# Patient Record
Sex: Female | Born: 1980 | Hispanic: Yes | Marital: Married | Smoking: Never smoker
Health system: Southern US, Community
[De-identification: ages and names within clinical notes are randomized; demographics above are authoritative.]

## PROBLEM LIST (undated history)

## (undated) DIAGNOSIS — F32A Depression, unspecified: Secondary | ICD-10-CM

## (undated) DIAGNOSIS — R87629 Unspecified abnormal cytological findings in specimens from vagina: Secondary | ICD-10-CM

## (undated) DIAGNOSIS — N83201 Unspecified ovarian cyst, right side: Secondary | ICD-10-CM

## (undated) HISTORY — DX: Depression, unspecified: F32.A

## (undated) HISTORY — DX: Unspecified abnormal cytological findings in specimens from vagina: R87.629

## (undated) HISTORY — DX: Unspecified ovarian cyst, right side: N83.201

---

## 2019-06-23 ENCOUNTER — Emergency Department (HOSPITAL_BASED_OUTPATIENT_CLINIC_OR_DEPARTMENT_OTHER)
Admission: EM | Admit: 2019-06-23 | Discharge: 2019-06-23 | Disposition: A | Discharge status: Home / Self Care | Attending: Emergency Medicine | Admitting: Emergency Medicine

## 2019-06-23 ENCOUNTER — Emergency Department (HOSPITAL_BASED_OUTPATIENT_CLINIC_OR_DEPARTMENT_OTHER)

## 2019-06-23 ENCOUNTER — Other Ambulatory Visit: Payer: Self-pay

## 2019-06-23 ENCOUNTER — Encounter (HOSPITAL_BASED_OUTPATIENT_CLINIC_OR_DEPARTMENT_OTHER): Payer: Self-pay | Admitting: Emergency Medicine

## 2019-06-23 DIAGNOSIS — N83202 Unspecified ovarian cyst, left side: Secondary | ICD-10-CM | POA: Insufficient documentation

## 2019-06-23 DIAGNOSIS — R1032 Left lower quadrant pain: Secondary | ICD-10-CM | POA: Insufficient documentation

## 2019-06-23 DIAGNOSIS — R102 Pelvic and perineal pain: Secondary | ICD-10-CM | POA: Diagnosis not present

## 2019-06-23 DIAGNOSIS — B3731 Acute candidiasis of vulva and vagina: Secondary | ICD-10-CM

## 2019-06-23 DIAGNOSIS — R109 Unspecified abdominal pain: Secondary | ICD-10-CM | POA: Diagnosis present

## 2019-06-23 DIAGNOSIS — B373 Candidiasis of vulva and vagina: Secondary | ICD-10-CM | POA: Diagnosis not present

## 2019-06-23 DIAGNOSIS — N760 Acute vaginitis: Secondary | ICD-10-CM | POA: Diagnosis not present

## 2019-06-23 DIAGNOSIS — B9689 Other specified bacterial agents as the cause of diseases classified elsewhere: Secondary | ICD-10-CM

## 2019-06-23 LAB — WET PREP, GENITAL
Sperm: NONE SEEN
Trich, Wet Prep: NONE SEEN

## 2019-06-23 LAB — URINALYSIS, ROUTINE W REFLEX MICROSCOPIC
Bilirubin Urine: NEGATIVE
Glucose, UA: NEGATIVE mg/dL
Hgb urine dipstick: NEGATIVE
Ketones, ur: >80 mg/dL — AB
Leukocytes,Ua: NEGATIVE
Nitrite: NEGATIVE
Protein, ur: NEGATIVE mg/dL
Specific Gravity, Urine: 1.025 (ref 1.005–1.030)
pH: 6.5 (ref 5.0–8.0)

## 2019-06-23 LAB — CBC WITH DIFFERENTIAL/PLATELET
Abs Immature Granulocytes: 0.02 10*3/uL (ref 0.00–0.07)
Basophils Absolute: 0 10*3/uL (ref 0.0–0.1)
Basophils Relative: 0 %
Eosinophils Absolute: 0 10*3/uL (ref 0.0–0.5)
Eosinophils Relative: 0 %
HCT: 38.2 % (ref 36.0–46.0)
Hemoglobin: 13.2 g/dL (ref 12.0–15.0)
Immature Granulocytes: 0 %
Lymphocytes Relative: 25 %
Lymphs Abs: 1.8 10*3/uL (ref 0.7–4.0)
MCH: 31.4 pg (ref 26.0–34.0)
MCHC: 34.6 g/dL (ref 30.0–36.0)
MCV: 90.7 fL (ref 80.0–100.0)
Monocytes Absolute: 0.4 10*3/uL (ref 0.1–1.0)
Monocytes Relative: 6 %
Neutro Abs: 5 10*3/uL (ref 1.7–7.7)
Neutrophils Relative %: 69 %
Platelets: 272 10*3/uL (ref 150–400)
RBC: 4.21 MIL/uL (ref 3.87–5.11)
RDW: 13.2 % (ref 11.5–15.5)
WBC: 7.4 10*3/uL (ref 4.0–10.5)
nRBC: 0 % (ref 0.0–0.2)

## 2019-06-23 LAB — COMPREHENSIVE METABOLIC PANEL
ALT: 26 U/L (ref 0–44)
AST: 27 U/L (ref 15–41)
Albumin: 4.3 g/dL (ref 3.5–5.0)
Alkaline Phosphatase: 23 U/L — ABNORMAL LOW (ref 38–126)
Anion gap: 12 (ref 5–15)
BUN: 13 mg/dL (ref 6–20)
CO2: 18 mmol/L — ABNORMAL LOW (ref 22–32)
Calcium: 9.2 mg/dL (ref 8.9–10.3)
Chloride: 103 mmol/L (ref 98–111)
Creatinine, Ser: 0.78 mg/dL (ref 0.44–1.00)
GFR calc Af Amer: >60 mL/min (ref >60)
GFR calc non Af Amer: >60 mL/min (ref >60)
Glucose, Bld: 127 mg/dL — ABNORMAL HIGH (ref 70–99)
Potassium: 3.4 mmol/L — ABNORMAL LOW (ref 3.5–5.1)
Sodium: 133 mmol/L — ABNORMAL LOW (ref 135–145)
Total Bilirubin: 0.8 mg/dL (ref 0.3–1.2)
Total Protein: 7.2 g/dL (ref 6.5–8.1)

## 2019-06-23 LAB — HCG, QUANTITATIVE, PREGNANCY: hCG, Beta Chain, Quant, S: <1 m[IU]/mL (ref <5)

## 2019-06-23 LAB — LIPASE, BLOOD: Lipase: 26 U/L (ref 11–51)

## 2019-06-23 MED ORDER — MORPHINE SULFATE (PF) 4 MG/ML IV SOLN
4.0000 mg | Freq: Once | INTRAVENOUS | Status: DC
  Filled 2019-06-23: qty 1

## 2019-06-23 MED ORDER — ONDANSETRON HCL 4 MG/2ML IJ SOLN
4.0000 mg | Freq: Once | INTRAMUSCULAR | Status: AC
  Administered 2019-06-23: 17:00:00 4 mg via INTRAVENOUS
  Filled 2019-06-23 (×2): qty 2

## 2019-06-23 MED ORDER — ONDANSETRON HCL 4 MG/2ML IJ SOLN
4.0000 mg | Freq: Once | INTRAMUSCULAR | Status: DC
  Filled 2019-06-23: qty 2

## 2019-06-23 MED ORDER — HYDROMORPHONE HCL 1 MG/ML IJ SOLN
1.0000 mg | Freq: Once | INTRAMUSCULAR | Status: AC
  Administered 2019-06-23: 1 mg via INTRAVENOUS
  Filled 2019-06-23: qty 1

## 2019-06-23 MED ORDER — SODIUM CHLORIDE 0.9 % IV BOLUS
1000.0000 mL | Freq: Once | INTRAVENOUS | Status: AC
  Administered 2019-06-23: 1000 mL via INTRAVENOUS

## 2019-06-23 MED ORDER — KETOROLAC TROMETHAMINE 30 MG/ML IJ SOLN
30.0000 mg | Freq: Once | INTRAMUSCULAR | Status: AC
  Administered 2019-06-23: 19:00:00 30 mg via INTRAVENOUS
  Filled 2019-06-23: qty 1

## 2019-06-23 MED ORDER — METRONIDAZOLE 500 MG PO TABS
500.0000 mg | ORAL_TABLET | Freq: Two times a day (BID) | ORAL | 0 refills | Status: DC
Start: 2019-06-23 — End: 2022-08-11

## 2019-06-23 MED ORDER — FLUCONAZOLE 200 MG PO TABS
200.0000 mg | ORAL_TABLET | Freq: Every day | ORAL | 0 refills | Status: AC
Start: 2019-06-23 — End: 2019-06-24

## 2019-06-23 MED ORDER — ONDANSETRON HCL 4 MG/2ML IJ SOLN
4.0000 mg | Freq: Once | INTRAMUSCULAR | Status: AC
  Administered 2019-06-23: 15:00:00 4 mg via INTRAVENOUS
  Filled 2019-06-23: qty 2

## 2019-06-23 MED ORDER — ONDANSETRON HCL 4 MG/2ML IJ SOLN
4.0000 mg | Freq: Once | INTRAMUSCULAR | Status: AC
  Administered 2019-06-23: 4 mg via INTRAVENOUS
  Filled 2019-06-23: qty 2

## 2019-06-23 MED ORDER — HYDROMORPHONE HCL 1 MG/ML IJ SOLN
1.0000 mg | Freq: Once | INTRAMUSCULAR | Status: AC
  Administered 2019-06-23: 15:00:00 1 mg via INTRAVENOUS
  Filled 2019-06-23: qty 1

## 2019-06-23 MED ORDER — HYDROCODONE-ACETAMINOPHEN 5-325 MG PO TABS: 1.0000 | ORAL_TABLET | Freq: Four times a day (QID) | ORAL | 0 refills | Status: DC | PRN

## 2019-06-23 MED ORDER — ONDANSETRON 4 MG PO TBDP
4.0000 mg | ORAL_TABLET | Freq: Three times a day (TID) | ORAL | 0 refills | Status: DC | PRN
Start: 2019-06-23 — End: 2022-08-11

## 2019-06-23 NOTE — ED Notes (Signed)
Patient transported to CT 

## 2019-06-23 NOTE — ED Provider Notes (Signed)
[facility] EMERGENCY DEPARTMENT Provider Note   CSN: [id number] Arrival date & time: 06/23/19  1414     History Chief Complaint  Patient presents with  . Abdominal Pain    Chelsea Mullen is a 39 y.o. female who presents for evaluation of sudden onset left lower abdominal pain that began at about 12 PM.  She was at a baseball game when she started having intense sharp pain in the left lower quadrant.  She took naproxen which did not help her symptoms.  She has some associated nausea but denies any vomiting.  She states that she felt a little bit of pressure this morning but states she thought it was her menstrual cycle starting.  Did not start having severe pain until about 12 PM this afternoon.  She states that the pain is sharp and cramping in nature and is mostly in the left lower quadrant.  She feels like maybe it radiates a little bit to the suprapubic region but not so much in the flank.  She reports that prior to onset of symptoms, she was in her normal state of health.  No fevers.  Her last menstrual cycle was about 2 to 3 weeks ago.  She has not had any chest pain, difficulty breathing, vomiting, dysuria, hematuria, vaginal discharge, vaginal bleeding.  She had a C-section but no other abdominal surgeries.  She has no personal history of kidney stones but does report family history.  The history is provided by the patient.       History reviewed. No pertinent past medical history.  There are no problems to display for this patient.   Past Surgical History:  Procedure Laterality Date  . CESAREAN SECTION       OB History   No obstetric history on file.     No family history on file.  Social History   Tobacco Use  . Smoking status: Never Smoker  . Smokeless tobacco: Never Used  Substance Use Topics  . Alcohol use: Yes  . Drug use: Not on file    Home Medications Prior to Admission medications   Medication Sig Start Date End Date Taking? Authorizing  Provider  fluconazole (DIFLUCAN) 200 MG tablet Take 1 tablet (200 mg total) by mouth daily for 1 day. 06/23/19 06/24/19  Maxwell Caul, PA-C  HYDROcodone-acetaminophen (NORCO/VICODIN) 5-325 MG tablet Take 1-2 tablets by mouth every 6 (six) hours as needed. 06/23/19   Maxwell Caul, PA-C  metroNIDAZOLE (FLAGYL) 500 MG tablet Take 1 tablet (500 mg total) by mouth 2 (two) times daily. 06/23/19   Maxwell Caul, PA-C  ondansetron (ZOFRAN ODT) 4 MG disintegrating tablet Take 1 tablet (4 mg total) by mouth every 8 (eight) hours as needed for nausea or vomiting. 06/23/19   Maxwell Caul, PA-C    Allergies    Patient has no known allergies.  Review of Systems   Review of Systems  Constitutional: Negative for fever.  Respiratory: Negative for cough and shortness of breath.   Cardiovascular: Negative for chest pain.  Gastrointestinal: Positive for abdominal pain and nausea. Negative for vomiting.  Genitourinary: Negative for dysuria, hematuria, vaginal bleeding and vaginal discharge.  Neurological: Negative for headaches.  All other systems reviewed and are negative.   Physical Exam Updated Vital Signs BP 106/62 (BP Location: Right Arm)   Pulse 60   Temp 97.7 F (36.5 C) (Oral)   Resp 14   Ht 5\' 4"  (1.626 m)   Wt 68 kg  LMP 06/05/2019   SpO2 100%   BMI 25.75 kg/m   Physical Exam Vitals and nursing note reviewed. Exam conducted with a chaperone present.  Constitutional:      Appearance: Normal appearance. She is well-developed.     Comments: Appears uncomfortable. Writhing around in pain.   HENT:     Head: Normocephalic and atraumatic.  Eyes:     General: Lids are normal.     Conjunctiva/sclera: Conjunctivae normal.     Pupils: Pupils are equal, round, and reactive to light.  Cardiovascular:     Rate and Rhythm: Normal rate and regular rhythm.     Pulses: Normal pulses.     Heart sounds: Normal heart sounds. No murmur. No friction rub. No gallop.   Pulmonary:      Effort: Pulmonary effort is normal.     Breath sounds: Normal breath sounds.     Comments: Lungs clear to auscultation bilaterally.  Symmetric chest rise.  No wheezing, rales, rhonchi. Abdominal:     Palpations: Abdomen is soft. Abdomen is not rigid.     Tenderness: There is abdominal tenderness in the left lower quadrant. There is no right CVA tenderness, left CVA tenderness or guarding.     Comments: Abdomen is soft, nondistended.  Tenderness palpation of the left lower quadrant.  No rigidity, guarding.  No CVA tenderness noted.  Genitourinary:    Comments: The exam was performed with a chaperone present. Normal external female genitalia. No lesions, rash, or sores. No CMT. Small amount of clear white discharged noted. Left adnexal tenderness noted. No mass. No right adnexal mass or tenderness.  Musculoskeletal:        General: Normal range of motion.     Cervical back: Full passive range of motion without pain.  Skin:    General: Skin is warm and dry.     Capillary Refill: Capillary refill takes less than 2 seconds.  Neurological:     Mental Status: She is alert and oriented to person, place, and time.  Psychiatric:        Speech: Speech normal.     ED Results / Procedures / Treatments   Labs (all labs ordered are listed, but only abnormal results are displayed) Labs Reviewed  WET PREP, GENITAL - Abnormal; Notable for the following components:      Result Value   Yeast Wet Prep HPF POC PRESENT (*)    Clue Cells Wet Prep HPF POC PRESENT (*)    WBC, Wet Prep HPF POC MODERATE (*)    All other components within normal limits  URINALYSIS, ROUTINE W REFLEX MICROSCOPIC - Abnormal; Notable for the following components:   Ketones, ur >80 (*)    All other components within normal limits  COMPREHENSIVE METABOLIC PANEL - Abnormal; Notable for the following components:   Sodium 133 (*)    Potassium 3.4 (*)    CO2 18 (*)    Glucose, Bld 127 (*)    Alkaline Phosphatase 23 (*)    All  other components within normal limits  CBC WITH DIFFERENTIAL/PLATELET  LIPASE, BLOOD  HCG, QUANTITATIVE, PREGNANCY  GC/CHLAMYDIA PROBE AMP ([place]) NOT AT [facility]  WET PREP  (BD AFFIRM) ([place])    EKG None  Radiology CT Renal Stone Study  Result Date: 06/23/2019 CLINICAL DATA:  Pelvic pain.  Recent urinary urgency but not today. EXAM: CT ABDOMEN AND PELVIS WITHOUT CONTRAST TECHNIQUE: Multidetector CT imaging of the abdomen and pelvis was performed following the standard protocol without IV  contrast. COMPARISON:  Pelvic ultrasound June 23, 2019 FINDINGS: Lower chest: No acute abnormality. Hepatobiliary: No focal liver abnormality is seen. No gallstones, gallbladder wall thickening, or biliary dilatation. Pancreas: Unremarkable. No pancreatic ductal dilatation or surrounding inflammatory changes. Spleen: Normal in size without focal abnormality. Adrenals/Urinary Tract: Adrenal glands are normal. Rounded increased attenuation in the left kidney on series 2, image 24 may represent a developing stone. No suspicious masses. No hydronephrosis or perinephric stranding. The bladder is unremarkable. The ureters are normal. Stomach/Bowel: There appears to be a single diverticulum near the junction of sigmoid colon and rectum. The colon and appendix are otherwise normal. Stomach and small bowel are normal. Vascular/Lymphatic: No significant vascular findings are present. No enlarged abdominal or pelvic lymph nodes. Reproductive: The uterus and right ovary are normal. There is a cystic mass in the region of the left adnexa. Recent pelvic ultrasound demonstrated 2 left ovarian cysts measuring 3.6 and 4.2 cm, accounting for this finding. Other: There is a small amount of free fluid in the pelvis, likely physiologic. Musculoskeletal: No acute or significant osseous findings. IMPRESSION: 1. There is a cystic structure in the left adnexa, better assessed on today's pelvic ultrasound. Please see that report for  full details. However, by report, 2 dominant cysts were seen in the left ovary on ultrasound. 2. Probable developing stone in the left kidney. No left renal obstruction. No ureteral stones. 3. There is a small amount of physiologic fluid in the pelvis. Electronically Signed   By: Dorise Bullion III M.D   On: 06/23/2019 17:24   US PELVIC COMPLETE W TRANSVAGINAL AND TORSION R/O  Result Date: 06/23/2019 CLINICAL DATA:  39 year old female with acute LEFT pelvic pain with nausea today. EXAM: TRANSABDOMINAL AND TRANSVAGINAL ULTRASOUND OF PELVIS DOPPLER ULTRASOUND OF OVARIES TECHNIQUE: Both transabdominal and transvaginal ultrasound examinations of the pelvis were performed. Transabdominal technique was performed for global imaging of the pelvis including uterus, ovaries, adnexal regions, and pelvic cul-de-sac. It was necessary to proceed with endovaginal exam following the transabdominal exam to visualize the ovaries and endometrium. Color and duplex Doppler ultrasound was utilized to evaluate blood flow to the ovaries. COMPARISON:  None. FINDINGS: Uterus Measurements: 8.3 x 3.8 x 4.2 cm = volume: 69 mL. No fibroids or other mass visualized. Endometrium Thickness: 4 mm.  No focal abnormality visualized. Right ovary Measurements: 2.1 x 1.6 x 1.2 cm = volume: 2.2 mL. Normal appearance/no adnexal mass. Left ovary Measurements: 5.5 x 3 x 3.3 cm = volume: 2.9 mL. Two LEFT ovarian cysts are noted, measuring 3.6 x 3 x 3.6 cm and 3.8 x 3.4 x 4.2 cm. No internal vascularity or mural nodularity noted. Pulsed Doppler evaluation of both ovaries demonstrates normal low-resistance arterial and venous waveforms. Other findings A trace amount of free pelvic fluid is noted. IMPRESSION: 1. Two LEFT ovarian cysts measuring 3.6 cm and 4.2 cm in greatest diameter. 2. Trace amount of free pelvic fluid. 3. No other significant abnormalities. No evidence of ovarian torsion. Electronically Signed   By: Margarette Canada M.D.   On: 06/23/2019 16:27     Procedures Procedures (including critical care time)  Medications Ordered in ED Medications  morphine 4 MG/ML injection 4 mg (0 mg Intravenous Hold 06/23/19 1703)  ondansetron (ZOFRAN) injection 4 mg (4 mg Intravenous Not Given 06/23/19 2032)  sodium chloride 0.9 % bolus 1,000 mL (0 mLs Intravenous Stopped 06/23/19 1629)  HYDROmorphone (DILAUDID) injection 1 mg (1 mg Intravenous Given 06/23/19 1511)  ondansetron (ZOFRAN) injection 4 mg (4  mg Intravenous Given 06/23/19 1509)  sodium chloride 0.9 % bolus 1,000 mL (0 mLs Intravenous Stopped 06/23/19 1748)  ondansetron (ZOFRAN) injection 4 mg (4 mg Intravenous Given 06/23/19 1724)  ketorolac (TORADOL) 30 MG/ML injection 30 mg (30 mg Intravenous Given 06/23/19 1854)  ondansetron (ZOFRAN) injection 4 mg (4 mg Intravenous Given 06/23/19 1853)  HYDROmorphone (DILAUDID) injection 1 mg (1 mg Intravenous Given 06/23/19 2024)  sodium chloride 0.9 % bolus 1,000 mL (0 mLs Intravenous Stopped 06/23/19 2127)    ED Course  I have reviewed the triage vital signs and the nursing notes.  Pertinent labs & imaging results that were available during my care of the patient were reviewed by me and considered in my medical decision making (see chart for details).    MDM Rules/Calculators/A&P                      39 year old female who presents for evaluation of left lower quadrant pain that began acutely at 12 PM.  Has been feeling some slight pressure prior to that but states symptoms started acutely and worsen.  Associated with nausea.  No vomiting.  No recent fevers.  No vaginal bleeding, vaginal discharge, urinary complaints.  On initial ED arrival, she is afebrile but appears very uncomfortable.  She is writhing around on bed.  She has point tenderness in the left lower quadrant.  No real true CVA tenderness.  Consider kidney stone though she does not have any personal history.  Also given her age, concern for possible ovarian torsion.  We will plan labs,  ultrasound.  Lipase normal. CBC shows no leukocytosis or anemia. CMP shows potassium of 3.4. hcg quant is negative. UA negative for any infectious etiology.   U/S shows two left ovarian cysts. No evidence of ovarian torsion. Will proceed with CT to ensure no evidence of kidney stone.   Pelvic exam showed small amount of discharge noted.  No CMT that would be concerning for PID.  She did have some left adnexal tenderness but no mass.  No right adnexal mass or tenderness.  CT scan shows a cystic structure in the left adnexa consistent with previously seen ovarian cyst. Probably developing left kidney stone. No renal obstruction or ureteral stones.   Patient had an additional episode of pain and vomiting.  Will give additional analgesics and reassess.  Urine negative for any infectious etiology.  Reevaluation.  Patient reports improvement in pain after additional rounds of analgesics.  She looks more comfortable.  She has been able to tolerate p.o. without any difficulty.  Repeat evaluation shows improvement in abdominal tenderness.  Patient states she is ready to go home.  I discussed with patient that she will need to follow-up with OB/GYN regarding her ovarian cyst.  Instructed patient that if any time, her symptoms worsen, she can return emergency department for further evaluation. At this time, patient exhibits no emergent life-threatening condition that require further evaluation in ED or admission. Discussed patient with Dr. Fredderick Phenix who is agreeable to plan. Patient had ample opportunity for questions and discussion. All patient's questions were answered with full understanding. Strict return precautions discussed. Patient expresses understanding and agreement to plan.  Portions of this note were generated with Scientist, clinical (histocompatibility and immunogenetics). Dictation errors may occur despite best attempts at proofreading.  Final Clinical Impression(s) / ED Diagnoses Final diagnoses:  Left ovarian cyst  Left lower  quadrant abdominal pain  Yeast vaginitis  Bacterial vaginitis    Rx / DC Orders ED  Discharge Orders         Ordered    HYDROcodone-acetaminophen (NORCO/VICODIN) 5-325 MG tablet  Every 6 hours PRN     06/23/19 2139    ondansetron (ZOFRAN ODT) 4 MG disintegrating tablet  Every 8 hours PRN     06/23/19 2139    metroNIDAZOLE (FLAGYL) 500 MG tablet  2 times daily     06/23/19 2139    fluconazole (DIFLUCAN) 200 MG tablet  Daily     06/23/19 2139           Maxwell Caul, PA-C 06/23/19 2223    Rolan Bucco, MD 06/23/19 [phone]

## 2019-06-23 NOTE — ED Notes (Signed)
Pt aware of need for urine specimen collection, unable to provide at this time.  Specimen cup sent with pt to u/s in case she is able to go while there.

## 2019-06-23 NOTE — ED Notes (Signed)
Pt reports return of nausea, pain remains 2/10.  Zofran given as ordered.  Pt declines morphine at this time for pain level.

## 2019-06-23 NOTE — ED Notes (Signed)
Transported to CT/US 

## 2019-06-23 NOTE — ED Triage Notes (Signed)
C/o menstrual cramps today. States she fell yesterday but was not injured.

## 2019-06-23 NOTE — ED Notes (Signed)
ED Provider at bedside. 

## 2019-06-23 NOTE — Discharge Instructions (Signed)
Your work up today showed a left ovarian cyst which can contribute to your symptoms.  Additionally, your pelvic exam showed evidence of yeast and bacterial vaginosis that we will plan to treat.  You can take Tylenol or Ibuprofen as directed for pain. You can alternate Tylenol and Ibuprofen every 4 hours. If you take Tylenol at 1pm, then you can take Ibuprofen at 5pm. Then you can take Tylenol again at 9pm.   Take pain medications as directed for break through pain. Do not drive or operate machinery while taking this medication.   Zofran as needed for nausea.  Take Flagyl as directed.  It is very important that you do not consume any alcohol while taking this medication as it will cause you to become violently ill.   After you finish Flagyl, you can take the Diflucan as directed.  As we discussed, follow-up with your OB/GYN regarding ovarian cyst.  Return the emergency department for any worsening pain, vomiting, fevers or any other worsening or concerning symptoms.

## 2019-06-24 ENCOUNTER — Emergency Department (HOSPITAL_BASED_OUTPATIENT_CLINIC_OR_DEPARTMENT_OTHER)
Admission: EM | Admit: 2019-06-24 | Discharge: 2019-06-24 | Disposition: A | Discharge status: Home / Self Care | Attending: Emergency Medicine | Admitting: Emergency Medicine

## 2019-06-24 ENCOUNTER — Emergency Department (HOSPITAL_BASED_OUTPATIENT_CLINIC_OR_DEPARTMENT_OTHER)

## 2019-06-24 ENCOUNTER — Encounter (HOSPITAL_BASED_OUTPATIENT_CLINIC_OR_DEPARTMENT_OTHER): Payer: Self-pay | Admitting: Emergency Medicine

## 2019-06-24 ENCOUNTER — Other Ambulatory Visit: Payer: Self-pay

## 2019-06-24 DIAGNOSIS — R112 Nausea with vomiting, unspecified: Secondary | ICD-10-CM | POA: Insufficient documentation

## 2019-06-24 DIAGNOSIS — R1032 Left lower quadrant pain: Secondary | ICD-10-CM | POA: Insufficient documentation

## 2019-06-24 DIAGNOSIS — N83202 Unspecified ovarian cyst, left side: Secondary | ICD-10-CM | POA: Insufficient documentation

## 2019-06-24 MED ORDER — ONDANSETRON 4 MG PO TBDP
4.0000 mg | ORAL_TABLET | Freq: Once | ORAL | Status: AC | PRN
  Administered 2019-06-24: 4 mg via ORAL

## 2019-06-24 MED ORDER — OXYCODONE-ACETAMINOPHEN 5-325 MG PO TABS
1.0000 | ORAL_TABLET | ORAL | Status: DC | PRN
  Administered 2019-06-24: 1 via ORAL
  Filled 2019-06-24: qty 1

## 2019-06-24 MED ORDER — ONDANSETRON 4 MG PO TBDP
ORAL_TABLET | ORAL | Status: AC
  Filled 2019-06-24: qty 1

## 2019-06-24 MED ORDER — OXYCODONE-ACETAMINOPHEN 5-325 MG PO TABS: 1.0000 | ORAL_TABLET | Freq: Four times a day (QID) | ORAL | 0 refills | Status: DC | PRN

## 2019-06-24 NOTE — ED Provider Notes (Signed)
10:34AM: Received phone call from Chelsea Mullen Chelsea Mullen)- requesting verbal order for Metronidazole prescribed from ED visit yesterday as it appears to have expired in their computer system. Reviewed chart from yesterday's ED visit, prescribed 500 mg BID for 7 days per AV[facility], verbal order provided to pharmacy for this prescription.    Chelsea Mullen 06/24/19 1035    Alvira Monday, MD 06/24/19 [phone]

## 2019-06-24 NOTE — Discharge Instructions (Signed)
Take Zofran for nausea prior to taking pain medication.  As we discussed, instead of taking hydrocodone, you will now take oxycodone.  You can take Tylenol or Ibuprofen as directed for pain. You can alternate Tylenol and Ibuprofen every 4 hours. If you take Tylenol at 1pm, then you can take Ibuprofen at 5pm. Then you can take Tylenol again at 9pm.   You should not take more than 4000 mg of Tylenol in a 24-hour.  Follow-up with referred OB/GYN for further evaluation.  Return to the Emergency Department immediately if you experience any worsening abdominal pain, fever, persistent nausea and vomiting, inability keep any food down, pain with urination, blood in your urine or any other worsening or concerning symptoms.

## 2019-06-24 NOTE — ED Provider Notes (Signed)
[facility] EMERGENCY DEPARTMENT Provider Note   CSN: [id number] Arrival date & time: 06/24/19  1900     History Chief Complaint  Patient presents with  . Abdominal Pain    Chelsea Mullen is a 39 y.o. female who presents for evaluation of abdominal pain.  Patient seen in the ED yesterday for evaluation of symptoms.  Patient had a work-up that showed evidence of 2 ovarian cyst on the left side.  Her CT scan was unremarkable as well as reassuring blood work.  She was able to be discharged home.  She comes back in today for continued pain, nausea/vomiting.  She reports that she was discharged home with hydrocodone, Zofran.  She took 1 dose at about 2:30 AM this morning.  She reports that she woke up and had not eaten anything so she did not want to take any of the hydrocodone.  She also did not take any Zofran.  She took Tylenol but had another episode of vomiting and she was concerned about taking any other pain medications that she had not taken any more.  She states that the pain is similar to what it was in yesterday and has not changed in location or nature.  She has not noted any fevers, dysuria, hematuria.  She denies any chest pain, difficulty breathing.  The history is provided by the patient.       History reviewed. No pertinent past medical history.  There are no problems to display for this patient.   Past Surgical History:  Procedure Laterality Date  . CESAREAN SECTION       OB History   No obstetric history on file.     History reviewed. No pertinent family history.  Social History   Tobacco Use  . Smoking status: Never Smoker  . Smokeless tobacco: Never Used  Substance Use Topics  . Alcohol use: Yes  . Drug use: Not on file    Home Medications Prior to Admission medications   Medication Sig Start Date End Date Taking? Authorizing Provider  fluconazole (DIFLUCAN) 200 MG tablet Take 1 tablet (200 mg total) by mouth daily for 1 day. 06/23/19 06/24/19   Maxwell Caul, PA-C  HYDROcodone-acetaminophen (NORCO/VICODIN) 5-325 MG tablet Take 1-2 tablets by mouth every 6 (six) hours as needed. 06/23/19   Maxwell Caul, PA-C  metroNIDAZOLE (FLAGYL) 500 MG tablet Take 1 tablet (500 mg total) by mouth 2 (two) times daily. 06/23/19   Maxwell Caul, PA-C  ondansetron (ZOFRAN ODT) 4 MG disintegrating tablet Take 1 tablet (4 mg total) by mouth every 8 (eight) hours as needed for nausea or vomiting. 06/23/19   Maxwell Caul, PA-C  oxyCODONE-acetaminophen (PERCOCET/ROXICET) 5-325 MG tablet Take 1-2 tablets by mouth every 6 (six) hours as needed for severe pain. 06/24/19   Maxwell Caul, PA-C    Allergies    Patient has no known allergies.  Review of Systems   Review of Systems  Constitutional: Negative for fever.  Respiratory: Negative for cough and shortness of breath.   Cardiovascular: Negative for chest pain.  Gastrointestinal: Positive for abdominal pain, nausea and vomiting.  Genitourinary: Negative for dysuria and hematuria.  Neurological: Negative for weakness, numbness and headaches.  All other systems reviewed and are negative.   Physical Exam Updated Vital Signs BP 110/66   Pulse 67   Temp 98.6 F (37 C) (Oral)   Resp 16   Ht 5\' 4"  (1.626 m)   Wt 72.6 kg   LMP 06/05/2019  SpO2 99%   BMI 27.46 kg/m   Physical Exam Vitals and nursing note reviewed.  Constitutional:      Appearance: Normal appearance. She is well-developed.     Comments: Patient is sitting comfortably on bed with no signs of distress.  HENT:     Head: Normocephalic and atraumatic.  Eyes:     General: Lids are normal.     Conjunctiva/sclera: Conjunctivae normal.     Pupils: Pupils are equal, round, and reactive to light.  Cardiovascular:     Rate and Rhythm: Normal rate and regular rhythm.     Pulses: Normal pulses.     Heart sounds: Normal heart sounds. No murmur. No friction rub. No gallop.   Pulmonary:     Effort: Pulmonary effort is  normal.     Breath sounds: Normal breath sounds.  Abdominal:     Palpations: Abdomen is soft. Abdomen is not rigid.     Tenderness: There is abdominal tenderness in the left lower quadrant. There is no guarding.     Comments: Abdomen is soft, nondistended.  Very mild tenderness in the left lower quadrant.  No CVA tenderness.  No rigidity, guarding.  Musculoskeletal:        General: Normal range of motion.     Cervical back: Full passive range of motion without pain.  Skin:    General: Skin is warm and dry.     Capillary Refill: Capillary refill takes less than 2 seconds.  Neurological:     Mental Status: She is alert and oriented to person, place, and time.  Psychiatric:        Speech: Speech normal.     ED Results / Procedures / Treatments   Labs (all labs ordered are listed, but only abnormal results are displayed) Labs Reviewed - No data to display  EKG None  Radiology US Transvaginal Non-OB  Result Date: 06/24/2019 CLINICAL DATA:  Initial evaluation for continued left lower quadrant pain. EXAM: TRANSVAGINAL ULTRASOUND OF PELVIS DOPPLER ULTRASOUND OF OVARIES TECHNIQUE: Transvaginal ultrasound examination of the pelvis was performed including evaluation of the uterus, ovaries, adnexal regions, and pelvic cul-de-sac. Color and duplex Doppler ultrasound was utilized to evaluate blood flow to the ovaries. COMPARISON:  Prior CT and ultrasound from 06/23/2019. FINDINGS: Uterus Measurements: 8.7 x 3.6 x 4.3 cm = volume: 71.3 mL. No fibroids or other mass visualized. Endometrium Thickness: 3 mm.  No focal abnormality visualized. Right ovary Not visualized.  No adnexal mass. Left ovary Measurements: 5.7 x 3.5 x 3.3 cm = volume: 35 mL. Two adjacent simple cysts again seen, measuring 3.7 x 2.9 x 3.2 cm and 3.7 x 4.0 x 4.1 cm respectively, little interval changed. No significant internal complexity, vascularity, or solid component. Pulsed Doppler evaluation demonstrates normal low-resistance  arterial and venous waveforms in the left ovary. Small to moderate volume free fluid within the pelvis, slightly increased from previous. IMPRESSION: 1. Two simple left ovarian cysts measuring 3.7 cm and 4.1 cm respectively, little interval change from previous. No evidence for associated torsion. 2. Small to moderate free fluid, mildly increased from previous. Electronically Signed   By: Rise Mu M.D.   On: 06/24/2019 20:10   [place] Art/Ven Flow Abd Pelv Doppler  Result Date: 06/24/2019 CLINICAL DATA:  Initial evaluation for continued left lower quadrant pain. EXAM: TRANSVAGINAL ULTRASOUND OF PELVIS DOPPLER ULTRASOUND OF OVARIES TECHNIQUE: Transvaginal ultrasound examination of the pelvis was performed including evaluation of the uterus, ovaries, adnexal regions, and pelvic cul-de-sac. Color and duplex  Doppler ultrasound was utilized to evaluate blood flow to the ovaries. COMPARISON:  Prior CT and ultrasound from 06/23/2019. FINDINGS: Uterus Measurements: 8.7 x 3.6 x 4.3 cm = volume: 71.3 mL. No fibroids or other mass visualized. Endometrium Thickness: 3 mm.  No focal abnormality visualized. Right ovary Not visualized.  No adnexal mass. Left ovary Measurements: 5.7 x 3.5 x 3.3 cm = volume: 35 mL. Two adjacent simple cysts again seen, measuring 3.7 x 2.9 x 3.2 cm and 3.7 x 4.0 x 4.1 cm respectively, little interval changed. No significant internal complexity, vascularity, or solid component. Pulsed Doppler evaluation demonstrates normal low-resistance arterial and venous waveforms in the left ovary. Small to moderate volume free fluid within the pelvis, slightly increased from previous. IMPRESSION: 1. Two simple left ovarian cysts measuring 3.7 cm and 4.1 cm respectively, little interval change from previous. No evidence for associated torsion. 2. Small to moderate free fluid, mildly increased from previous. Electronically Signed   By: Rise Mu M.D.   On: 06/24/2019 20:10   CT Renal Stone  Study  Result Date: 06/23/2019 CLINICAL DATA:  Pelvic pain.  Recent urinary urgency but not today. EXAM: CT ABDOMEN AND PELVIS WITHOUT CONTRAST TECHNIQUE: Multidetector CT imaging of the abdomen and pelvis was performed following the standard protocol without IV contrast. COMPARISON:  Pelvic ultrasound June 23, 2019 FINDINGS: Lower chest: No acute abnormality. Hepatobiliary: No focal liver abnormality is seen. No gallstones, gallbladder wall thickening, or biliary dilatation. Pancreas: Unremarkable. No pancreatic ductal dilatation or surrounding inflammatory changes. Spleen: Normal in size without focal abnormality. Adrenals/Urinary Tract: Adrenal glands are normal. Rounded increased attenuation in the left kidney on series 2, image 24 may represent a developing stone. No suspicious masses. No hydronephrosis or perinephric stranding. The bladder is unremarkable. The ureters are normal. Stomach/Bowel: There appears to be a single diverticulum near the junction of sigmoid colon and rectum. The colon and appendix are otherwise normal. Stomach and small bowel are normal. Vascular/Lymphatic: No significant vascular findings are present. No enlarged abdominal or pelvic lymph nodes. Reproductive: The uterus and right ovary are normal. There is a cystic mass in the region of the left adnexa. Recent pelvic ultrasound demonstrated 2 left ovarian cysts measuring 3.6 and 4.2 cm, accounting for this finding. Other: There is a small amount of free fluid in the pelvis, likely physiologic. Musculoskeletal: No acute or significant osseous findings. IMPRESSION: 1. There is a cystic structure in the left adnexa, better assessed on today's pelvic ultrasound. Please see that report for full details. However, by report, 2 dominant cysts were seen in the left ovary on ultrasound. 2. Probable developing stone in the left kidney. No left renal obstruction. No ureteral stones. 3. There is a small amount of physiologic fluid in the pelvis.  Electronically Signed   By: Gerome Sam III M.D   On: 06/23/2019 17:24   US PELVIC COMPLETE W TRANSVAGINAL AND TORSION R/O  Result Date: 06/23/2019 CLINICAL DATA:  39 year old female with acute LEFT pelvic pain with nausea today. EXAM: TRANSABDOMINAL AND TRANSVAGINAL ULTRASOUND OF PELVIS DOPPLER ULTRASOUND OF OVARIES TECHNIQUE: Both transabdominal and transvaginal ultrasound examinations of the pelvis were performed. Transabdominal technique was performed for global imaging of the pelvis including uterus, ovaries, adnexal regions, and pelvic cul-de-sac. It was necessary to proceed with endovaginal exam following the transabdominal exam to visualize the ovaries and endometrium. Color and duplex Doppler ultrasound was utilized to evaluate blood flow to the ovaries. COMPARISON:  None. FINDINGS: Uterus Measurements: 8.3 x 3.8 x 4.2  cm = volume: 69 mL. No fibroids or other mass visualized. Endometrium Thickness: 4 mm.  No focal abnormality visualized. Right ovary Measurements: 2.1 x 1.6 x 1.2 cm = volume: 2.2 mL. Normal appearance/no adnexal mass. Left ovary Measurements: 5.5 x 3 x 3.3 cm = volume: 2.9 mL. Two LEFT ovarian cysts are noted, measuring 3.6 x 3 x 3.6 cm and 3.8 x 3.4 x 4.2 cm. No internal vascularity or mural nodularity noted. Pulsed Doppler evaluation of both ovaries demonstrates normal low-resistance arterial and venous waveforms. Other findings A trace amount of free pelvic fluid is noted. IMPRESSION: 1. Two LEFT ovarian cysts measuring 3.6 cm and 4.2 cm in greatest diameter. 2. Trace amount of free pelvic fluid. 3. No other significant abnormalities. No evidence of ovarian torsion. Electronically Signed   By: Margarette Canada M.D.   On: 06/23/2019 16:27    Procedures Procedures (including critical care time)  Medications Ordered in ED Medications  oxyCODONE-acetaminophen (PERCOCET/ROXICET) 5-325 MG per tablet 1 tablet (1 tablet Oral Given 06/24/19 1916)  ondansetron (ZOFRAN-ODT) disintegrating  tablet 4 mg (4 mg Oral Given 06/24/19 1916)    ED Course  I have reviewed the triage vital signs and the nursing notes.  Pertinent labs & imaging results that were available during my care of the patient were reviewed by me and considered in my medical decision making (see chart for details).    MDM Rules/Calculators/A&P                      39 year old female who presents for evaluation of left lower quadrant abdominal pain.  P seen here yesterday for evaluation of similar symptoms and was diagnosed with left ovarian cyst.  Work-up was otherwise reassuring.  On initially arrival, she is afebrile, nontoxic-appearing.  Vital signs are stable.  On exam, patient reports improvement in pain.  States that pain is 1/10.  She has some very mild tenderness noted to left lower quadrant but no rigidity, guarding.  She is otherwise very well-appearing.  Patient had Percocet prior to coming back into the room which she states helped controlled her pain.  On further discussion with her, patient reports that she had not taken any of the pain medication since earlier this morning.  She had not taken any of the nausea medication and was concerned about taking medication on an empty stomach.  She then reports that she started having vomiting and so she did not take any other more pain medication.  No fevers, urinary complaints.  History of ovarian cyst, ovarian ultrasound was ordered in triage for evaluation of possible torsion.  Ultrasound reviewed.  Ovarian cyst seen as previous imaging from yesterday.  No change.  No evidence of torsion.  She has small to moderate free fluid.  Mild increase from previous.  I discussed results with patient.  At this time, do not feel that repeat pelvic or CT scan is warranted. I discussed treatment options with patient here in the ED.  I think most of her symptoms are related to the fact that she did not take any of the pain medication was unable to get her pain under control.  With  a dose of Percocet here in the ED, she feels much better and is much more comfortable.  Additionally, patient was concerned about following up with OB/GYN and was wondering if OB/GYN could come see her in the ED.  At this time, do not feel that she warrants an acute OB/GYN emergency and feel  that she is most appropriately managed for outpatient OB/GYN follow-up.  After extensive discussion with patient, I did offer for blood work, further monitoring here in the emergency department.  We also discussed switching her pain medication from hydrocodone to oxycodone since that seemed to work better for her.  After extensive discussion, we engaged in shared decision making and patient wished to be discharged home with small amount of Percocet to help with pain.  Patient did not wish to have repeat blood work, repeat imaging at this time.  I feel that this is reasonable.  We will give patient outpatient OB/GYN referral for her to follow-up with. At this time, patient exhibits no emergent life-threatening condition that require further evaluation in ED or admission. Patient had ample opportunity for questions and discussion. All patient's questions were answered with full understanding. Strict return precautions discussed. Patient expresses understanding and agreement to plan.   Portions of this note were generated with Scientist, clinical (histocompatibility and immunogenetics)Dragon dictation software. Dictation errors may occur despite best attempts at proofreading.   Final Clinical Impression(s) / ED Diagnoses Final diagnoses:  Left lower quadrant abdominal pain  Cyst of left ovary    Rx / DC Orders ED Discharge Orders         Ordered    oxyCODONE-acetaminophen (PERCOCET/ROXICET) 5-325 MG tablet  Every 6 hours PRN     06/24/19 2253           Maxwell CaulLayden, Augustino Savastano A, PA-C 06/24/19 2325    Rolan BuccoBelfi, Melanie, MD 06/24/19 2344

## 2019-06-24 NOTE — ED Notes (Signed)
Pt teaching provided on medications that may cause drowsiness. Pt instructed not to drive or operate heavy machinery while taking the prescribed medication. Pt verbalized understanding.   

## 2019-06-24 NOTE — ED Triage Notes (Addendum)
Pt reports dx with ovarian cysts, pt c/o abdominal pain, reports unable to tolerate LLQ pain. Pt reports N/V

## 2019-06-25 LAB — GC/CHLAMYDIA PROBE AMP (~~LOC~~) NOT AT ARMC
Chlamydia: NEGATIVE
Comment: NEGATIVE
Comment: NORMAL
Neisseria Gonorrhea: NEGATIVE

## 2020-08-29 IMAGING — CT CT RENAL STONE PROTOCOL
2 of 4 series · 16 of 46 positions shown, 18 images · non-contrast
Comparison: Pelvic ultrasound June 23, 2019

CLINICAL DATA: Pelvic pain.  Recent urinary urgency but not today.

EXAM:
CT ABDOMEN AND PELVIS WITHOUT CONTRAST
TECHNIQUE: Multidetector CT imaging of the abdomen and pelvis was performed
following the standard protocol without IV contrast.

[Series 2: axial st · axial · 0.74mm/px · z∈[-456,-71]mm · 13 of 85 slices shown, 15 images]
[im 4/85  soft-tissue]
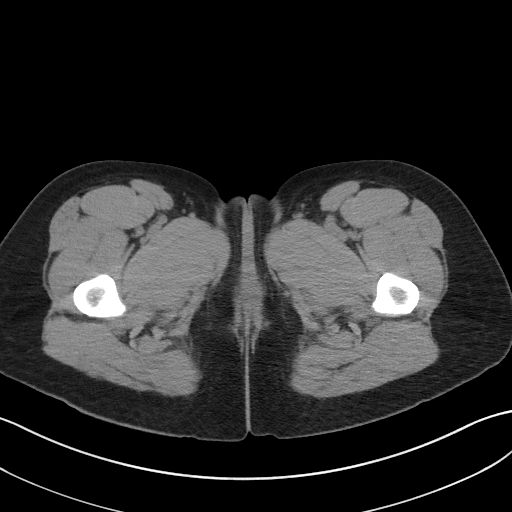
[im 4/85  bone]
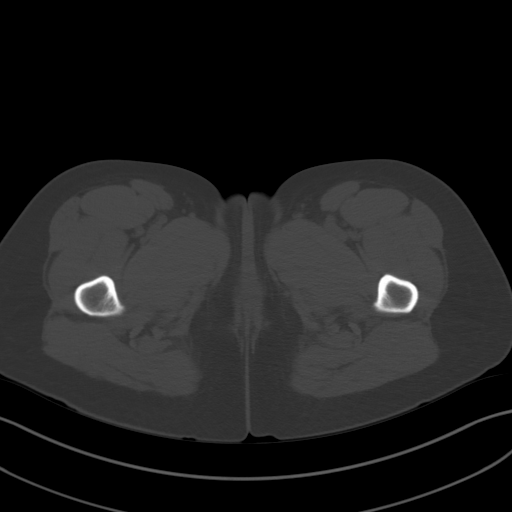
[im 11/85  soft-tissue]
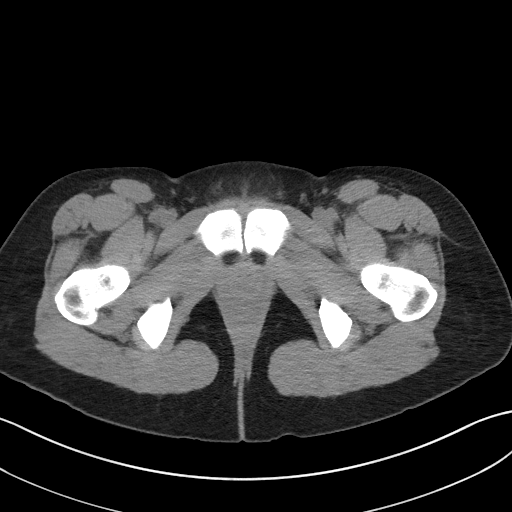
[im 17/85  soft-tissue]
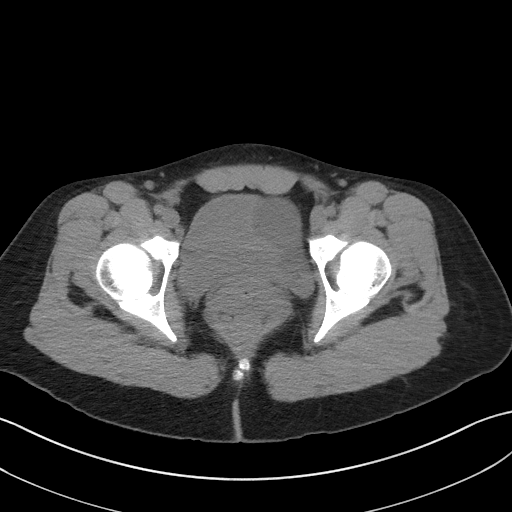
[im 24/85  soft-tissue]
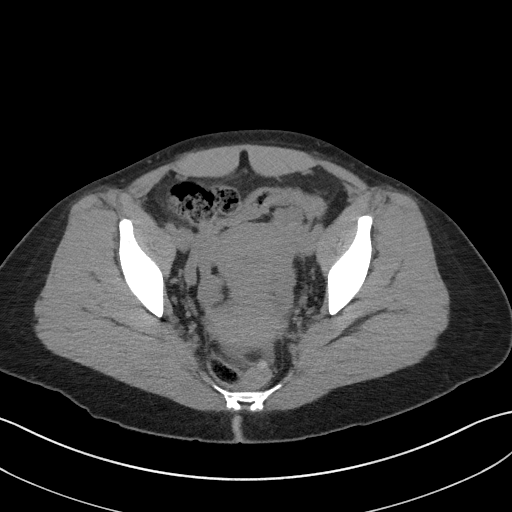
[im 31/85  soft-tissue]
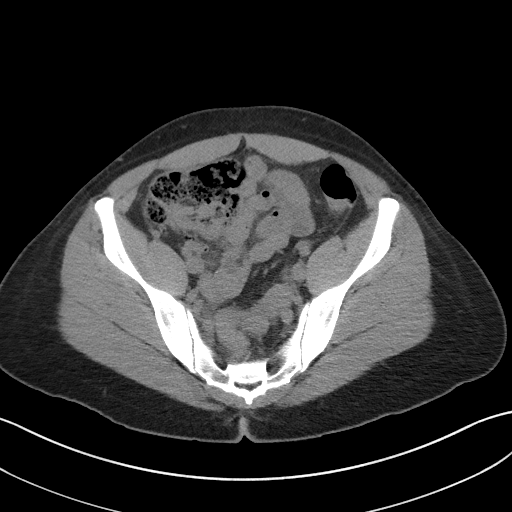
[im 37/85  soft-tissue]
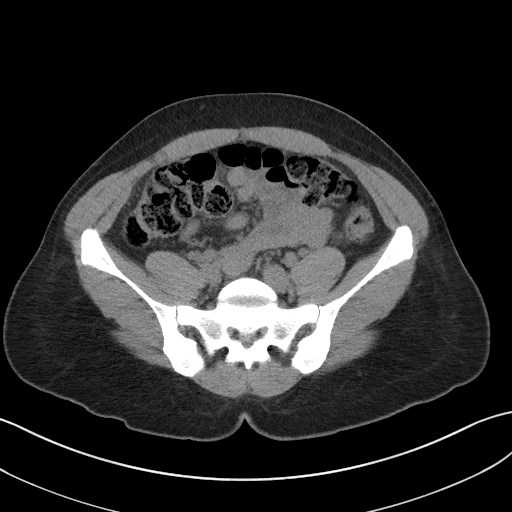
[im 44/85  soft-tissue]
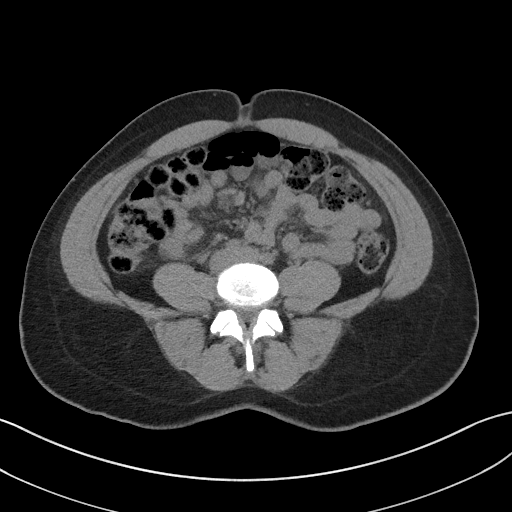
[im 48/85  soft-tissue]
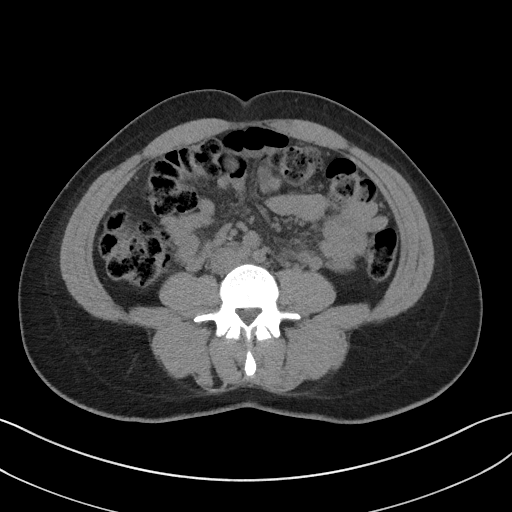
[im 54/85  soft-tissue]
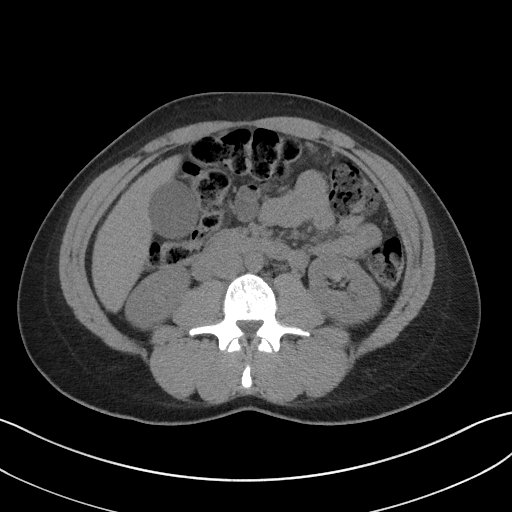
[im 54/85  bone]
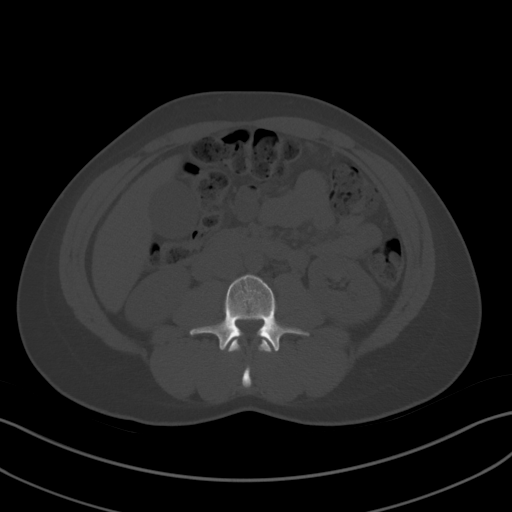
[im 61/85  soft-tissue]
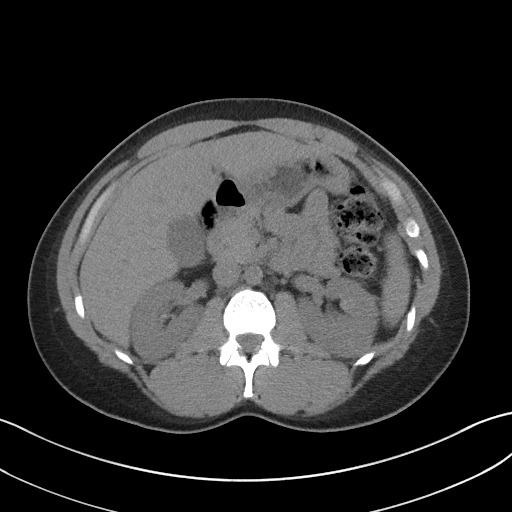
[im 68/85  soft-tissue]
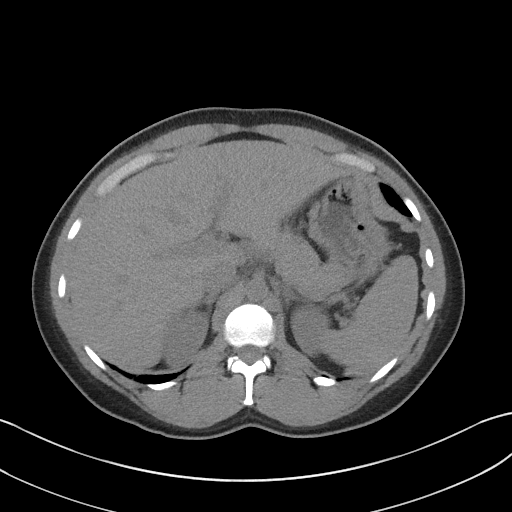
[im 74/85  soft-tissue]
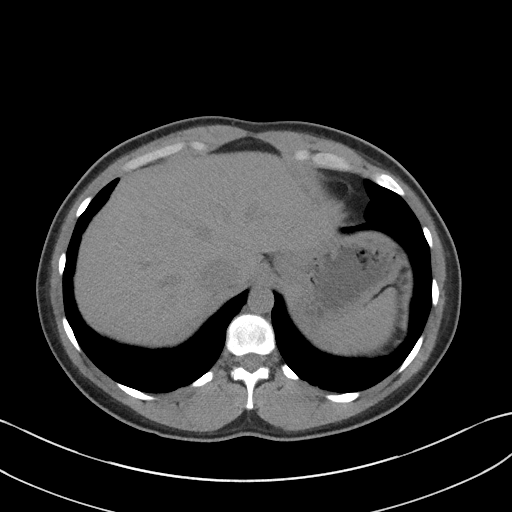
[im 81/85  soft-tissue]
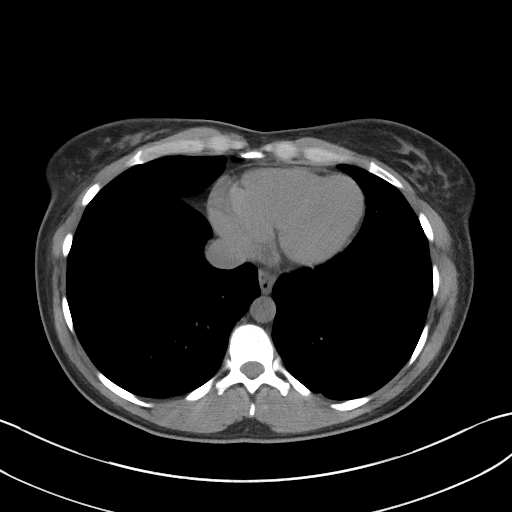

[Series 5: coronal st · coronal · 0.84mm/px · 3 of 100 slices shown]
[im 34/100  soft-tissue]
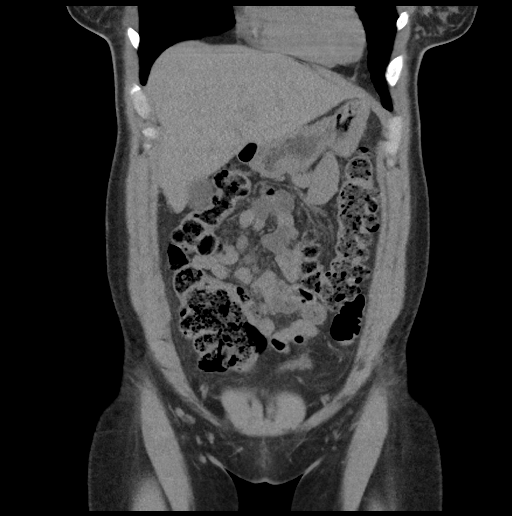
[im 45/100  soft-tissue]
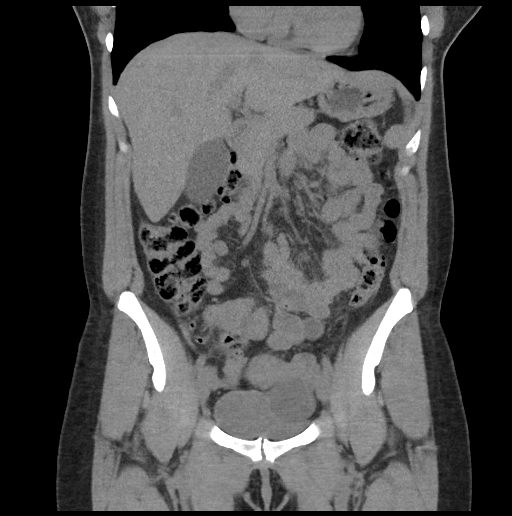
[im 56/100  soft-tissue]
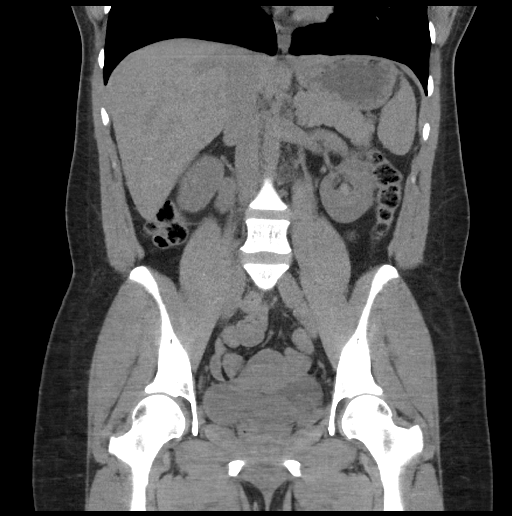

[16 of 46 positions shown; findings below may reference images not displayed]

FINDINGS: Lower chest: No acute abnormality.

Hepatobiliary: No focal liver abnormality is seen. No gallstones,
gallbladder wall thickening, or biliary dilatation.

Pancreas: Unremarkable. No pancreatic ductal dilatation or
surrounding inflammatory changes.

Spleen: Normal in size without focal abnormality.

Adrenals/Urinary Tract: Adrenal glands are normal. Rounded increased
attenuation in the left kidney on series 2, image [DATE] represent a
developing stone. No suspicious masses. No hydronephrosis or
perinephric stranding. The bladder is unremarkable. The ureters are
normal.

Stomach/Bowel: There appears to be a single diverticulum near the
junction of sigmoid colon and rectum. The colon and appendix are
otherwise normal. Stomach and small bowel are normal.

Vascular/Lymphatic: No significant vascular findings are present. No
enlarged abdominal or pelvic lymph nodes.

Reproductive: The uterus and right ovary are normal. There is a
cystic mass in the region of the left adnexa. Recent pelvic
ultrasound demonstrated 2 left ovarian cysts measuring 3.6 and
cm, accounting for this finding.

Other: There is a small amount of free fluid in the pelvis, likely
physiologic.

Musculoskeletal: No acute or significant osseous findings.
IMPRESSION: 1. There is a cystic structure in the left adnexa, better assessed
on today's pelvic ultrasound. Please see that report for full
details. However, by report, 2 dominant cysts were seen in the left
ovary on ultrasound.
2. Probable developing stone in the left kidney. No left renal
obstruction. No ureteral stones.
3. There is a small amount of physiologic fluid in the pelvis.

## 2020-10-10 ENCOUNTER — Emergency Department (HOSPITAL_BASED_OUTPATIENT_CLINIC_OR_DEPARTMENT_OTHER)
Admission: EM | Admit: 2020-10-10 | Discharge: 2020-10-11 | Disposition: A | Discharge status: Home / Self Care | Attending: Emergency Medicine | Admitting: Emergency Medicine

## 2020-10-10 ENCOUNTER — Other Ambulatory Visit: Payer: Self-pay

## 2020-10-10 DIAGNOSIS — T7840XA Allergy, unspecified, initial encounter: Secondary | ICD-10-CM | POA: Diagnosis present

## 2020-10-10 DIAGNOSIS — T782XXA Anaphylactic shock, unspecified, initial encounter: Secondary | ICD-10-CM | POA: Insufficient documentation

## 2020-10-10 DIAGNOSIS — T63481A Toxic effect of venom of other arthropod, accidental (unintentional), initial encounter: Secondary | ICD-10-CM | POA: Insufficient documentation

## 2020-10-10 DIAGNOSIS — R000 Tachycardia, unspecified: Secondary | ICD-10-CM | POA: Diagnosis not present

## 2020-10-10 LAB — CBC WITH DIFFERENTIAL/PLATELET
Abs Immature Granulocytes: 0.1 10*3/uL — ABNORMAL HIGH (ref 0.00–0.07)
Basophils Absolute: 0 10*3/uL (ref 0.0–0.1)
Basophils Relative: 0 %
Eosinophils Absolute: 0 10*3/uL (ref 0.0–0.5)
Eosinophils Relative: 0 %
HCT: 35.9 % — ABNORMAL LOW (ref 36.0–46.0)
Hemoglobin: 12.4 g/dL (ref 12.0–15.0)
Immature Granulocytes: 1 %
Lymphocytes Relative: 8 %
Lymphs Abs: 0.9 10*3/uL (ref 0.7–4.0)
MCH: 31.3 pg (ref 26.0–34.0)
MCHC: 34.5 g/dL (ref 30.0–36.0)
MCV: 90.7 fL (ref 80.0–100.0)
Monocytes Absolute: 0.3 10*3/uL (ref 0.1–1.0)
Monocytes Relative: 2 %
Neutro Abs: 10.8 10*3/uL — ABNORMAL HIGH (ref 1.7–7.7)
Neutrophils Relative %: 89 %
Platelets: 233 10*3/uL (ref 150–400)
RBC: 3.96 MIL/uL (ref 3.87–5.11)
RDW: 12.5 % (ref 11.5–15.5)
WBC: 12.2 10*3/uL — ABNORMAL HIGH (ref 4.0–10.5)
nRBC: 0 % (ref 0.0–0.2)

## 2020-10-10 LAB — COMPREHENSIVE METABOLIC PANEL
ALT: 23 U/L (ref 0–44)
AST: 28 U/L (ref 15–41)
Albumin: 3.9 g/dL (ref 3.5–5.0)
Alkaline Phosphatase: 21 U/L — ABNORMAL LOW (ref 38–126)
Anion gap: 8 (ref 5–15)
BUN: 20 mg/dL (ref 6–20)
CO2: 24 mmol/L (ref 22–32)
Calcium: 8.5 mg/dL — ABNORMAL LOW (ref 8.9–10.3)
Chloride: 107 mmol/L (ref 98–111)
Creatinine, Ser: 1.02 mg/dL — ABNORMAL HIGH (ref 0.44–1.00)
GFR, Estimated: >60 mL/min (ref >60)
Glucose, Bld: 128 mg/dL — ABNORMAL HIGH (ref 70–99)
Potassium: 3.7 mmol/L (ref 3.5–5.1)
Sodium: 139 mmol/L (ref 135–145)
Total Bilirubin: 0.4 mg/dL (ref 0.3–1.2)
Total Protein: 6.5 g/dL (ref 6.5–8.1)

## 2020-10-10 MED ORDER — DIPHENHYDRAMINE HCL 25 MG PO TABS
ORAL_TABLET | ORAL | 0 refills | Status: DC
Start: 2020-10-10 — End: 2022-08-11

## 2020-10-10 MED ORDER — DIPHENHYDRAMINE HCL 25 MG PO CAPS
25.0000 mg | ORAL_CAPSULE | Freq: Once | ORAL | Status: AC
  Administered 2020-10-11: 25 mg via ORAL
  Filled 2020-10-10: qty 1

## 2020-10-10 MED ORDER — PREDNISONE 20 MG PO TABS
ORAL_TABLET | ORAL | 0 refills | Status: DC
Start: 2020-10-11 — End: 2022-08-11

## 2020-10-10 MED ORDER — EPINEPHRINE 0.3 MG/0.3ML IJ SOAJ
0.3000 mg | Freq: Once | INTRAMUSCULAR | Status: AC
  Administered 2020-10-10: 0.3 mg via INTRAMUSCULAR

## 2020-10-10 MED ORDER — DIPHENHYDRAMINE HCL 50 MG/ML IJ SOLN
INTRAMUSCULAR | Status: AC
  Administered 2020-10-10: 50 mg
  Filled 2020-10-10: qty 1

## 2020-10-10 MED ORDER — FAMOTIDINE IN NACL 20-0.9 MG/50ML-% IV SOLN
20.0000 mg | Freq: Once | INTRAVENOUS | Status: AC
  Administered 2020-10-10: 20 mg via INTRAVENOUS

## 2020-10-10 MED ORDER — SODIUM CHLORIDE 0.9 % IV BOLUS
1000.0000 mL | Freq: Once | INTRAVENOUS | Status: AC
  Administered 2020-10-10: 1000 mL via INTRAVENOUS

## 2020-10-10 MED ORDER — PREDNISONE 50 MG PO TABS
60.0000 mg | ORAL_TABLET | Freq: Once | ORAL | Status: AC
  Administered 2020-10-11: 60 mg via ORAL
  Filled 2020-10-10: qty 1

## 2020-10-10 MED ORDER — FAMOTIDINE 20 MG PO TABS
20.0000 mg | ORAL_TABLET | Freq: Two times a day (BID) | ORAL | 0 refills | Status: DC
Start: 2020-10-10 — End: 2022-08-11

## 2020-10-10 MED ORDER — EPINEPHRINE 0.3 MG/0.3ML IJ SOAJ
0.3000 mg | INTRAMUSCULAR | 0 refills | Status: DC | PRN
Start: 2020-10-10 — End: 2022-08-11

## 2020-10-10 MED ORDER — EPINEPHRINE 0.3 MG/0.3ML IJ SOAJ
INTRAMUSCULAR | Status: AC
  Filled 2020-10-10: qty 0.3

## 2020-10-10 MED ORDER — METHYLPREDNISOLONE SODIUM SUCC 125 MG IJ SOLR
125.0000 mg | Freq: Once | INTRAMUSCULAR | Status: AC
  Administered 2020-10-10: 125 mg via INTRAVENOUS
  Filled 2020-10-10: qty 2

## 2020-10-10 NOTE — ED Triage Notes (Signed)
States multiple yellow jacket stings approx 30 min ago.  Decreased LOC, no h/o allergy to bees, N/V noted

## 2020-10-10 NOTE — ED Notes (Signed)
ED Provider at bedside. 

## 2020-10-10 NOTE — ED Notes (Signed)
EPI pen given at time of arrival at [facility]

## 2020-10-10 NOTE — Discharge Instructions (Addendum)
1.  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  Keep an epinephrine pen with you at all times.  If you get a sting or an allergic reaction that results in rash all over your body, difficulty breathing, tightness in your throat, severe nausea and abdominal pain or shortness of breath, use your epinephrine pen. 2.  You have been given a dose of prednisone in the emergency department late this evening.  Start the prednisone as prescribed tomorrow evening.  Continue taking 1-2 Benadryl tablets every 6 hours for the next 24 hours.  At that point, if you are not having any rash or hives, you can only take the Benadryl if needed.  Continue taking Pepcid twice daily for a week. 3.  Follow-up with your family doctor for recheck.  Return to the emergency department immediately if you are getting recurrence of symptoms that require use of the epinephrine pen or gradually worsening symptoms that are not improving with treatment.

## 2020-10-10 NOTE — Progress Notes (Signed)
RT assessed patient upon arrival to room. SAT 100%, BBS clear but was very lethargic prior to epi. RT to monitor as needed

## 2020-10-10 NOTE — ED Provider Notes (Addendum)
[facility] EMERGENCY DEPARTMENT Provider Note   CSN: [id number] Arrival date & time: 10/10/20  1828     History Chief Complaint  Patient presents with   Allergic Reaction    Chelsea Mullen is a 40 y.o. female.  HPI Patient is a 40 year old female who presents to the emergency department via POV with her husband due to an allergic reaction.  Patient initially obtunded speaking in only short whispers with obvious vomitus on her chin and shirt.  She was given an EpiPen and had almost immediate improvement in her symptoms.  She still speaks in whispers but states that her ago was being attacked by yellow jackets or wasps and she attempted to rescue the goat and was subsequently stung multiple times by the insects.  Her husband is now bedside and states that she immediately began having facial swelling, nausea, vomiting, as well as a diffuse rash across her body.  He attempted to give her Benadryl at home but she immediately vomited the medication up in route.  Denies any known history of anaphylaxis and patient and her husband denies she has ever required an EpiPen in the past.    No past medical history on file.  There are no problems to display for this patient.   Past Surgical History:  Procedure Laterality Date   CESAREAN SECTION       OB History   No obstetric history on file.     No family history on file.  Social History   Tobacco Use   Smoking status: Never   Smokeless tobacco: Never  Substance Use Topics   Alcohol use: Yes    Home Medications Prior to Admission medications   Medication Sig Start Date End Date Taking? Authorizing Provider  HYDROcodone-acetaminophen (NORCO/VICODIN) 5-325 MG tablet Take 1-2 tablets by mouth every 6 (six) hours as needed. 06/23/19   Maxwell Caul, PA-C  metroNIDAZOLE (FLAGYL) 500 MG tablet Take 1 tablet (500 mg total) by mouth 2 (two) times daily. 06/23/19   Maxwell Caul, PA-C  ondansetron (ZOFRAN ODT) 4 MG  disintegrating tablet Take 1 tablet (4 mg total) by mouth every 8 (eight) hours as needed for nausea or vomiting. 06/23/19   Maxwell Caul, PA-C  oxyCODONE-acetaminophen (PERCOCET/ROXICET) 5-325 MG tablet Take 1-2 tablets by mouth every 6 (six) hours as needed for severe pain. 06/24/19   Maxwell Caul, PA-C    Allergies    Patient has no known allergies.  Review of Systems   Review of Systems  Unable to perform ROS: Acuity of condition   Physical Exam Updated Vital Signs BP 125/79   Pulse 87   Temp 97.8 F (36.6 C) (Oral)   Resp 16   Ht 5\' 4"  (1.626 m)   Wt 72.6 kg   SpO2 99%   BMI 27.47 kg/m   Physical Exam Vitals and nursing note reviewed.  Constitutional:      General: She is in acute distress.     Appearance: Normal appearance. She is normal weight. She is not ill-appearing, toxic-appearing or diaphoretic.  HENT:     Head: Normocephalic and atraumatic.     Right Ear: External ear normal.     Left Ear: External ear normal.     Nose: Nose normal.     Mouth/Throat:     Mouth: Mucous membranes are moist.     Pharynx: Oropharynx is clear. No oropharyngeal exudate or posterior oropharyngeal erythema.     Comments: No intraoral swelling noted.  No  lingual swelling.  Readily handling secretions. Eyes:     General: No scleral icterus.       Right eye: No discharge.        Left eye: No discharge.     Conjunctiva/sclera: Conjunctivae normal.     Comments: Mild edema noted across the upper eyelids.  Cardiovascular:     Rate and Rhythm: Regular rhythm. Tachycardia present.     Pulses: Normal pulses.     Heart sounds: Normal heart sounds. No murmur heard.   No friction rub. No gallop.  Pulmonary:     Effort: Pulmonary effort is normal. No respiratory distress.     Breath sounds: Normal breath sounds. No stridor. No wheezing, rhonchi or rales.     Comments: Tachypneic.  Initially stridorous which improved after an EpiPen.  Lungs are clear to auscultation bilaterally.   No wheezing. Abdominal:     General: Abdomen is flat.     Palpations: Abdomen is soft.     Tenderness: There is no abdominal tenderness.  Musculoskeletal:        General: Normal range of motion.     Cervical back: Normal range of motion and neck supple. No tenderness.  Skin:    General: Skin is warm and dry.     Findings: Erythema present.     Comments: Multiple insect bites across the extremities.  Mild edema noted across the face and eyelids.    Neurological:     General: No focal deficit present.     Mental Status: She is alert and oriented to person, place, and time.   ED Results / Procedures / Treatments   Labs (all labs ordered are listed, but only abnormal results are displayed) Labs Reviewed  COMPREHENSIVE METABOLIC PANEL  CBC WITH DIFFERENTIAL/PLATELET   EKG None  Radiology No results found.  Procedures .Critical Care  Date/Time: 10/10/2020 6:51 PM Performed by: Placido Sou, PA-C Authorized by: Placido Sou, PA-C   Critical care provider statement:    Critical care time (minutes):  30   Critical care was necessary to treat or prevent imminent or life-threatening deterioration of the following conditions: Anaphylaxis.   Critical care was time spent personally by me on the following activities:  Evaluation of patient's response to treatment, examination of patient, ordering and performing treatments and interventions, ordering and review of laboratory studies, pulse oximetry, re-evaluation of patient's condition and obtaining history from patient or surrogate   Medications Ordered in ED Medications  diphenhydrAMINE (BENADRYL) 50 MG/ML injection (50 mg  Given 10/10/20 1834)  famotidine (PEPCID) IVPB 20 mg premix (20 mg Intravenous New Bag/Given 10/10/20 1836)  methylPREDNISolone sodium succinate (SOLU-MEDROL) 125 mg/2 mL injection 125 mg (125 mg Intravenous Given 10/10/20 1840)  EPINEPHrine (EPI-PEN) injection 0.3 mg (0.3 mg Intramuscular Given 10/10/20 1829)   sodium chloride 0.9 % bolus 1,000 mL (1,000 mLs Intravenous New Bag/Given 10/10/20 1836)   ED Course  I have reviewed the triage vital signs and the nursing notes.  Pertinent labs & imaging results that were available during my care of the patient were reviewed by me and considered in my medical decision making (see chart for details).  Clinical Course as of 10/10/20 2226  Fri Oct 10, 2020  3545 Patient reassessed about 10 minutes after receiving an EpiPen.  Now sitting upright.  Speaking in whisper still but now able to speak in complete sentences.  Reports lightheadedness as well as some mild chest tightness but otherwise states that her symptoms are significantly improved.  No  additional nausea or vomiting.  We will continue to closely monitor. [LJ]  2117 Patient is much improved.  She is resting quietly.  No rebound of symptoms at this point [MP]    Clinical Course User Index [LJ] Placido Sou, PA-C [MP] Arby Barrette, MD   MDM Rules/Calculators/A&P                          Pt is a 40 y.o. female who presents to the emergency department due to what appears to be an anaphylactic reaction.  Labs: CBC and CMP pending.  I, Placido Sou, PA-C, personally reviewed and evaluated these images and lab results as part of my medical decision-making.  Patient initially presented with urticaria across her body, mild facial swelling, vomitus along her chin and chest, and was nearly obtunded and speaking in whispers.  EpiPen was immediately given to the patient and her symptoms began improving.  Patient then started on IV fluids, Solu-Medrol, Benadryl, as well as famotidine.  She has been reassessed on multiple occasions and is now sitting upright and speaking in complete sentences.  On my most recent reassessment she states that she has some mild tingling in her extremities and some lightheadedness but otherwise feels much better.  No nausea or vomiting.  No continued facial swelling.   Readily handling secretions.  No respiratory distress.  It is the end of my shift and patient care is being transferred to Dr. Donnald Garre for final disposition.  Note: Portions of this report may have been transcribed using voice recognition software. Every effort was made to ensure accuracy; however, inadvertent computerized transcription errors may be present.   Final Clinical Impression(s) / ED Diagnoses Final diagnoses:  Anaphylaxis, initial encounter  Allergic reaction to insect sting, accidental or unintentional, initial encounter   Rx / DC Orders ED Discharge Orders     None        Placido Sou, PA-C 10/10/20 1933    Placido Sou, PA-C 10/10/20 2228    Arby Barrette, MD 10/31/20 1455

## 2021-07-20 LAB — OB RESULTS CONSOLE GBS: GBS: NEGATIVE

## 2022-01-11 LAB — OB RESULTS CONSOLE RPR: RPR: REACTIVE

## 2022-01-11 LAB — OB RESULTS CONSOLE GC/CHLAMYDIA
Chlamydia: NEGATIVE
Neisseria Gonorrhea: NEGATIVE

## 2022-01-11 LAB — OB RESULTS CONSOLE ABO/RH: RH Type: POSITIVE

## 2022-01-11 LAB — OB RESULTS CONSOLE HIV ANTIBODY (ROUTINE TESTING): HIV: NONREACTIVE

## 2022-01-11 LAB — OB RESULTS CONSOLE HEPATITIS B SURFACE ANTIGEN: Hepatitis B Surface Ag: NEGATIVE

## 2022-01-11 LAB — OB RESULTS CONSOLE RUBELLA ANTIBODY, IGM: Rubella: IMMUNE

## 2022-01-20 LAB — OB RESULTS CONSOLE GBS: GBS: NEGATIVE

## 2022-02-11 ENCOUNTER — Encounter: Payer: Self-pay | Admitting: Obstetrics and Gynecology

## 2022-02-11 ENCOUNTER — Other Ambulatory Visit: Payer: Self-pay | Admitting: Obstetrics and Gynecology

## 2022-02-11 DIAGNOSIS — O289 Unspecified abnormal findings on antenatal screening of mother: Secondary | ICD-10-CM

## 2022-02-12 ENCOUNTER — Encounter: Payer: Self-pay | Admitting: *Deleted

## 2022-02-18 ENCOUNTER — Ambulatory Visit: Attending: Maternal & Fetal Medicine | Admitting: Maternal & Fetal Medicine

## 2022-02-18 ENCOUNTER — Other Ambulatory Visit: Payer: Self-pay | Admitting: *Deleted

## 2022-02-18 ENCOUNTER — Ambulatory Visit: Attending: Obstetrics and Gynecology

## 2022-02-18 ENCOUNTER — Encounter: Payer: Self-pay | Admitting: *Deleted

## 2022-02-18 ENCOUNTER — Ambulatory Visit: Admitting: *Deleted

## 2022-02-18 VITALS — BP 104/68 | HR 74

## 2022-02-18 DIAGNOSIS — O289 Unspecified abnormal findings on antenatal screening of mother: Secondary | ICD-10-CM | POA: Insufficient documentation

## 2022-02-18 DIAGNOSIS — O09521 Supervision of elderly multigravida, first trimester: Secondary | ICD-10-CM | POA: Diagnosis not present

## 2022-02-18 DIAGNOSIS — O3111X0 Continuing pregnancy after spontaneous abortion of one fetus or more, first trimester, not applicable or unspecified: Secondary | ICD-10-CM

## 2022-02-18 DIAGNOSIS — Z3A13 13 weeks gestation of pregnancy: Secondary | ICD-10-CM | POA: Diagnosis not present

## 2022-02-18 DIAGNOSIS — O30001 Twin pregnancy, unspecified number of placenta and unspecified number of amniotic sacs, first trimester: Secondary | ICD-10-CM

## 2022-02-18 DIAGNOSIS — O285 Abnormal chromosomal and genetic finding on antenatal screening of mother: Secondary | ICD-10-CM

## 2022-02-18 DIAGNOSIS — Z363 Encounter for antenatal screening for malformations: Secondary | ICD-10-CM

## 2022-02-18 DIAGNOSIS — O280 Abnormal hematological finding on antenatal screening of mother: Secondary | ICD-10-CM | POA: Diagnosis present

## 2022-02-18 NOTE — Progress Notes (Signed)
MFM Consult Note Patient Name: Chelsea Mullen  Patient MRN:   [id number]  Referring provider: Ma Hillock  Reason for Consult: Positive cell free DNA test for vanishing twin versus triploidy.  HPI: Chelsea Mullen is a 41 y.o. G4P3003 at [redacted]w[redacted]d  here for ultrasound and consultation.   Floetta underwent cell free DNA testing via panorama and had a report of an increased risk for triploidy versus a vanishing twin.  I discussed that this testing looks for different types of DNA ratios and it is impossible to know if the abnormal result is from a vanishing twin versus tiploidy without doing an amniocentesis.  She declined at this time but is thinking about potentially doing in the future.  I discussed this is best done after 16 weeks due to allowing the amnion diffuse to the chorion to minimize the risk of complications.  I discussed the risk complications include loss of pregnancy, bleeding and possible damage to the fetus but this is rare overall ranging from 1 and [id number].  I also discussed the early ultrasound that she had a direct referring provider's office did show concern for an early twin pregnancy with a blighted ovum of the second twin.  I discussed this could be consistent with the vanishing twin.    Review of Systems: A review of systems was performed and was negative except per HPI   Vitals and Physical Exam 104/68, pulse 74, weight 165, BMI 28.3 Sitting comfortably on the sonogram table Nonlabored breathing Normal rate and rhythm Abdomen is nontender  Genetic testing: High risk for fetal triploidy versus vanishing twin.  Sonographic findngs Single intrauterine pregnancy at  13w 6d with EDD of 08/20/2022 dated by LMP  (11/13/21). Ultrasound was showing fetal growth just beyond 14 weeks so biometry was performed which was normal. However, since the measurments are within one week of her current EDD we will keep her EDD based on her LMP.  Observed fetal cardiac activity. There are no discernable  fetal abnormalities although the ability to assess the anatomy is very limited based on the gestational age.  There is a small echogenic area adjacent to the uterus that is suggestive of a vanishing twin.   Assessment -Abnormal cell free DNA result, high risk for triploidy versus vanishing twin Plan -No major abnormalities on today's ultrasound -Invasive diagnostic testing was declined but if she is interested in the future this can be done after 16 weeks.  Please refer back if desired.  We can also do this at her anatomy survey around 19 weeks. -Fetal anatomic survey ordered for 18 to 19 weeks.  I spent 45 minutes reviewing the patients chart, including labs and images as well as counseling the patient about her medical conditions.  Braxton Feathers  MFM, [facility]   02/18/2022  5:43 PM

## 2022-03-01 NOTE — L&D Delivery Note (Signed)
   Delivery Note:   [id number] at [redacted]w[redacted]d  Admitting diagnosis: Normal labor [O80, Z37.9] Risks: AMA, moderate meconium, VBAC, depression on Zoloft Onset of labor: 08/09/2022 at 2200 IOL/Augmentation: AROM ROM: 08/10/2022 at 0626  Complete dilation at 08/10/2022  0640 Onset of pushing at 0640 FHR second stage Cat II, variables with pushing  Analgesia/Anesthesia intrapartum:None  Pushing in hands and knees and lithotomy position with CNM and L&D staff support. Husband, Alycia Rossetti, present for birth and supportive.  Delivery of a Live born female  Birth Weight:  pending APGAR: 8, 9  Newborn Delivery   Birth date/time: 08/10/2022 06:49:34 Delivery type: VBAC, Spontaneous     in cephalic presentation, position OA to ROA.  APGAR:1 min-8 , 5 min-9   Nuchal Cord: No  Cord double clamped after cessation of pulsation, cut by Alycia Rossetti.  Collection of cord blood for typing completed. Arterial cord blood sample-No    Placenta delivered-Spontaneous  with 3 vessels . Uterotonics: Pitocin Placenta to L&D Uterine tone firm  Bleeding scant  1st degree  laceration identified.  Episiotomy:None  Local analgesia: Lidocaine  Repair: 3-0 in usual fashion Est. Blood Loss (mL):68.00   Complications: None  Mom to postpartum. Maudie Flakes to Couplet care / Skin to Skin.  Delivery Report:   Review the Delivery Report for details.    June Leap, CNM, MSN 08/10/2022, 7:09 AM

## 2022-03-25 ENCOUNTER — Ambulatory Visit: Admitting: *Deleted

## 2022-03-25 ENCOUNTER — Other Ambulatory Visit: Payer: Self-pay | Admitting: *Deleted

## 2022-03-25 ENCOUNTER — Ambulatory Visit: Attending: Maternal & Fetal Medicine

## 2022-03-25 VITALS — BP 113/63 | HR 72

## 2022-03-25 DIAGNOSIS — Z363 Encounter for antenatal screening for malformations: Secondary | ICD-10-CM

## 2022-03-25 DIAGNOSIS — O3112X0 Continuing pregnancy after spontaneous abortion of one fetus or more, second trimester, not applicable or unspecified: Secondary | ICD-10-CM | POA: Diagnosis not present

## 2022-03-25 DIAGNOSIS — O30002 Twin pregnancy, unspecified number of placenta and unspecified number of amniotic sacs, second trimester: Secondary | ICD-10-CM

## 2022-03-25 DIAGNOSIS — O285 Abnormal chromosomal and genetic finding on antenatal screening of mother: Secondary | ICD-10-CM

## 2022-03-25 DIAGNOSIS — O09522 Supervision of elderly multigravida, second trimester: Secondary | ICD-10-CM

## 2022-03-25 DIAGNOSIS — Z3A18 18 weeks gestation of pregnancy: Secondary | ICD-10-CM

## 2022-03-25 DIAGNOSIS — Z362 Encounter for other antenatal screening follow-up: Secondary | ICD-10-CM

## 2022-04-23 ENCOUNTER — Ambulatory Visit: Attending: Maternal & Fetal Medicine

## 2022-04-23 ENCOUNTER — Ambulatory Visit: Admitting: *Deleted

## 2022-04-23 VITALS — BP 106/64 | HR 67

## 2022-04-23 DIAGNOSIS — Z362 Encounter for other antenatal screening follow-up: Secondary | ICD-10-CM

## 2022-04-23 DIAGNOSIS — O09522 Supervision of elderly multigravida, second trimester: Secondary | ICD-10-CM

## 2022-04-23 DIAGNOSIS — O3662X0 Maternal care for excessive fetal growth, second trimester, not applicable or unspecified: Secondary | ICD-10-CM | POA: Diagnosis not present

## 2022-04-23 DIAGNOSIS — O3112X0 Continuing pregnancy after spontaneous abortion of one fetus or more, second trimester, not applicable or unspecified: Secondary | ICD-10-CM

## 2022-04-23 DIAGNOSIS — Z3A23 23 weeks gestation of pregnancy: Secondary | ICD-10-CM

## 2022-04-23 DIAGNOSIS — O30002 Twin pregnancy, unspecified number of placenta and unspecified number of amniotic sacs, second trimester: Secondary | ICD-10-CM

## 2022-04-23 DIAGNOSIS — O285 Abnormal chromosomal and genetic finding on antenatal screening of mother: Secondary | ICD-10-CM

## 2022-08-10 ENCOUNTER — Encounter (HOSPITAL_COMMUNITY): Payer: Self-pay | Admitting: Obstetrics & Gynecology

## 2022-08-10 ENCOUNTER — Other Ambulatory Visit: Payer: Self-pay

## 2022-08-10 ENCOUNTER — Inpatient Hospital Stay (HOSPITAL_COMMUNITY)
Admission: AD | Admit: 2022-08-10 | Discharge: 2022-08-11 | DRG: 807 | Disposition: A | Discharge status: Home / Self Care | Attending: Obstetrics & Gynecology | Admitting: Obstetrics & Gynecology

## 2022-08-10 DIAGNOSIS — O99344 Other mental disorders complicating childbirth: Secondary | ICD-10-CM | POA: Diagnosis present

## 2022-08-10 DIAGNOSIS — F32A Depression, unspecified: Secondary | ICD-10-CM | POA: Diagnosis present

## 2022-08-10 DIAGNOSIS — F419 Anxiety disorder, unspecified: Secondary | ICD-10-CM | POA: Diagnosis present

## 2022-08-10 DIAGNOSIS — Z79899 Other long term (current) drug therapy: Secondary | ICD-10-CM

## 2022-08-10 DIAGNOSIS — O09529 Supervision of elderly multigravida, unspecified trimester: Secondary | ICD-10-CM

## 2022-08-10 DIAGNOSIS — O34219 Maternal care for unspecified type scar from previous cesarean delivery: Secondary | ICD-10-CM | POA: Diagnosis present

## 2022-08-10 DIAGNOSIS — Z3A38 38 weeks gestation of pregnancy: Secondary | ICD-10-CM

## 2022-08-10 DIAGNOSIS — O26893 Other specified pregnancy related conditions, third trimester: Secondary | ICD-10-CM | POA: Diagnosis present

## 2022-08-10 LAB — CBC
HCT: 37.1 % (ref 36.0–46.0)
Hemoglobin: 13 g/dL (ref 12.0–15.0)
MCH: 32.5 pg (ref 26.0–34.0)
MCHC: 35 g/dL (ref 30.0–36.0)
MCV: 92.8 fL (ref 80.0–100.0)
Platelets: 225 10*3/uL (ref 150–400)
RBC: 4 MIL/uL (ref 3.87–5.11)
RDW: 14 % (ref 11.5–15.5)
WBC: 9.9 10*3/uL (ref 4.0–10.5)
nRBC: 0 % (ref 0.0–0.2)

## 2022-08-10 LAB — TYPE AND SCREEN
ABO/RH(D): A POS
Antibody Screen: NEGATIVE

## 2022-08-10 LAB — RPR
RPR Ser Ql: REACTIVE — AB
RPR Titer: 1:2 {titer}

## 2022-08-10 MED ORDER — ONDANSETRON HCL 4 MG/2ML IJ SOLN: 4.0000 mg | INTRAMUSCULAR | Status: DC | PRN

## 2022-08-10 MED ORDER — ONDANSETRON HCL 4 MG PO TABS: 4.0000 mg | ORAL_TABLET | ORAL | Status: DC | PRN

## 2022-08-10 MED ORDER — BENZOCAINE-MENTHOL 20-0.5 % EX AERO
1.0000 | INHALATION_SPRAY | CUTANEOUS | Status: DC | PRN
  Administered 2022-08-10: 1 via TOPICAL
  Filled 2022-08-10: qty 56

## 2022-08-10 MED ORDER — ACETAMINOPHEN 500 MG PO TABS: 1000.0000 mg | ORAL_TABLET | Freq: Four times a day (QID) | ORAL | Status: DC | PRN

## 2022-08-10 MED ORDER — ACETAMINOPHEN 325 MG PO TABS
650.0000 mg | ORAL_TABLET | ORAL | Status: DC | PRN
  Administered 2022-08-10: 650 mg via ORAL
  Filled 2022-08-10: qty 2

## 2022-08-10 MED ORDER — OXYTOCIN 10 UNIT/ML IJ SOLN: 10.0000 [IU] | Freq: Once | INTRAMUSCULAR | Status: DC

## 2022-08-10 MED ORDER — SERTRALINE HCL 25 MG PO TABS
25.0000 mg | ORAL_TABLET | Freq: Every day | ORAL | Status: DC
  Administered 2022-08-10 – 2022-08-11 (×2): 25 mg via ORAL
  Filled 2022-08-10 (×2): qty 1

## 2022-08-10 MED ORDER — SOD CITRATE-CITRIC ACID 500-334 MG/5ML PO SOLN: 30.0000 mL | ORAL | Status: DC | PRN

## 2022-08-10 MED ORDER — LIDOCAINE HCL (PF) 1 % IJ SOLN
30.0000 mL | INTRAMUSCULAR | Status: AC | PRN
  Administered 2022-08-10: 30 mL via SUBCUTANEOUS
  Filled 2022-08-10: qty 30

## 2022-08-10 MED ORDER — OXYTOCIN-SODIUM CHLORIDE 30-0.9 UT/500ML-% IV SOLN
2.5000 [IU]/h | INTRAVENOUS | Status: DC
  Filled 2022-08-10: qty 500

## 2022-08-10 MED ORDER — WITCH HAZEL-GLYCERIN EX PADS: 1.0000 | MEDICATED_PAD | CUTANEOUS | Status: DC | PRN

## 2022-08-10 MED ORDER — FENTANYL CITRATE (PF) 100 MCG/2ML IJ SOLN: 50.0000 ug | INTRAMUSCULAR | Status: DC | PRN

## 2022-08-10 MED ORDER — SODIUM CHLORIDE 0.9% FLUSH: 3.0000 mL | Freq: Two times a day (BID) | INTRAVENOUS | Status: DC

## 2022-08-10 MED ORDER — DIBUCAINE (PERIANAL) 1 % EX OINT: 1.0000 | TOPICAL_OINTMENT | CUTANEOUS | Status: DC | PRN

## 2022-08-10 MED ORDER — SODIUM CHLORIDE 0.9 % IV SOLN: INTRAVENOUS | Status: DC | PRN

## 2022-08-10 MED ORDER — COCONUT OIL OIL
1.0000 | TOPICAL_OIL | Status: DC | PRN
  Administered 2022-08-10: 1 via TOPICAL

## 2022-08-10 MED ORDER — SIMETHICONE 80 MG PO CHEW: 80.0000 mg | CHEWABLE_TABLET | ORAL | Status: DC | PRN

## 2022-08-10 MED ORDER — TETANUS-DIPHTH-ACELL PERTUSSIS 5-2.5-18.5 LF-MCG/0.5 IM SUSY: 0.5000 mL | PREFILLED_SYRINGE | Freq: Once | INTRAMUSCULAR | Status: DC

## 2022-08-10 MED ORDER — DIPHENHYDRAMINE HCL 25 MG PO CAPS: 25.0000 mg | ORAL_CAPSULE | Freq: Four times a day (QID) | ORAL | Status: DC | PRN

## 2022-08-10 MED ORDER — LACTATED RINGERS IV SOLN: 500.0000 mL | INTRAVENOUS | Status: DC | PRN

## 2022-08-10 MED ORDER — LACTATED RINGERS IV SOLN: INTRAVENOUS | Status: DC

## 2022-08-10 MED ORDER — OXYTOCIN BOLUS FROM INFUSION
333.0000 mL | Freq: Once | INTRAVENOUS | Status: AC
  Administered 2022-08-10: 333 mL via INTRAVENOUS

## 2022-08-10 MED ORDER — SODIUM CHLORIDE 0.9% FLUSH: 3.0000 mL | INTRAVENOUS | Status: DC | PRN

## 2022-08-10 MED ORDER — SENNOSIDES-DOCUSATE SODIUM 8.6-50 MG PO TABS
2.0000 | ORAL_TABLET | Freq: Every day | ORAL | Status: DC
  Administered 2022-08-11: 2 via ORAL
  Filled 2022-08-10: qty 2

## 2022-08-10 MED ORDER — PRENATAL MULTIVITAMIN CH
1.0000 | ORAL_TABLET | Freq: Every day | ORAL | Status: DC
  Administered 2022-08-10 – 2022-08-11 (×2): 1 via ORAL
  Filled 2022-08-10 (×2): qty 1

## 2022-08-10 MED ORDER — IBUPROFEN 600 MG PO TABS
600.0000 mg | ORAL_TABLET | Freq: Four times a day (QID) | ORAL | Status: DC
  Administered 2022-08-10 – 2022-08-11 (×5): 600 mg via ORAL
  Filled 2022-08-10 (×5): qty 1

## 2022-08-10 MED ORDER — ZOLPIDEM TARTRATE 5 MG PO TABS: 5.0000 mg | ORAL_TABLET | Freq: Every evening | ORAL | Status: DC | PRN

## 2022-08-10 MED ORDER — ONDANSETRON HCL 4 MG/2ML IJ SOLN: 4.0000 mg | Freq: Four times a day (QID) | INTRAMUSCULAR | Status: DC | PRN

## 2022-08-10 NOTE — Lactation Note (Addendum)
This note was copied from a baby's chart. Lactation Consultation Note  Patient Name: Chelsea Mullen Date: 08/10/2022 Age:40 hours Reason for consult: Initial assessment;Early term 37-38.6wks;Breastfeeding assistance  The infant is at 59 hours old.  Per the birth parent the infant has latched since birth.  She stated that it was painful on the right side.  The birth parent breast fed her 3 other children.  She said that her 3rd child reacted to coconut oil.  LC encouraged the birth parent to use her EBM for healing if she has a concern about the coconut oil.  LC reviewed the outpatient services brochure.  LC asked the birth parent to call for assistance with the latch at the next feeding.  The birth parent does not have a breast pump at home.  LC sent a stork pump form per her request.   Infant Feeding Plan:  Breastfeed 8+ times in 24 hours according to feeding cues.  Hand express and feed the expressed milk to the infant via a spoon.  Call RN/LC for assistance with breastfeeding.    Maternal Data Has patient been taught Hand Expression?: Yes Does the patient have breastfeeding experience prior to this delivery?: Yes How long did the patient breastfeed?: 1st=1 year, 2nd=1year, 3rd = 6-7 months  Feeding Mother's Current Feeding Choice: Breast Milk and Formula  LATCH Score Latch: Grasps breast easily, tongue down, lips flanged, rhythmical sucking.  Audible Swallowing: Spontaneous and intermittent  Type of Nipple: Everted at rest and after stimulation  Comfort (Breast/Nipple): Soft / non-tender  Hold (Positioning): No assistance needed to correctly position infant at breast.  LATCH Score: 10  Interventions Interventions: [facility] Services brochure  Consult Status Consult Status: Follow-up Date: 08/11/22 Follow-up type: In-patient   Chelsea Mullen 08/10/2022, 11:01 AM

## 2022-08-10 NOTE — H&P (Signed)
OB ADMISSION/ HISTORY & PHYSICAL:  Admission Date: 08/10/2022  5:32 AM  Admit Diagnosis: Normal labor [O80, Z37.9]    Chelsea Mullen is a 42 y.o. female [phone] at [redacted]w[redacted]d presenting for active labor. Patient reports contractions started last night and had + bloody show. Denies leaking of fluid. Endorses + fetal movement. Planning repeat VBAC, history of PLTCS with G1 for NRFHTs, followed by 2 successful VBACs. Husband, Chelsea Mullen, present and supportive. Eagerly anticipating baby boy "[name]".   Prenatal History: [id number]   EDC: 08/20/2022 Prenatal care at [facility] since 10 weeks  Primary: A. Yetta Barre, CNM  Prenatal course complicated by: History of PLTCS with G1 for NRFHTs, 2 successful VBACs, planning repeat VBAC Vanishing twin with HR NIPT, seen by MFM and amnio negative AMA, reassuring ANFTing Anxiety and depression, stable on Zoloft False positive RPR at NOB, negative treponema pallidum antibodies  Prenatal Labs: ABO, Rh:   A POS Antibody: PENDING (06/11 0600) Rubella:   Immune RPR:   Positive, negative treponema pallidum antibodies HBsAg:   Negative HIV:   Negative GBS:   Negative 1 hr Glucola : 60 Genetic Screening: HR NIPT due to vanishing twin, amnio negative with MFM Ultrasound: normal XY anatomy, posterior placenta, AGA at 34 weeks 67%    Maternal Diabetes: No Genetic Screening: Abnormal:  Results: Other: Maternal Ultrasounds/Referrals: Normal Fetal Ultrasounds or other Referrals:  Referred to Materal Fetal Medicine  Maternal Substance Abuse:  No Significant Maternal Medications:  Meds include: Zoloft Significant Maternal Lab Results:  Group B Strep negative Other Comments:  None  Medical / Surgical History : Past medical history:  Past Medical History:  Diagnosis Date   Bilateral ovarian cysts    Depression    Vaginal Pap smear, abnormal     Past surgical history:  Past Surgical History:  Procedure Laterality Date   CESAREAN SECTION      Family History:   Family History  Problem Relation Age of Onset   Heart disease Mother    Obesity Mother    Diabetes Mother    Asthma Sister    Diabetes Maternal Aunt    Diabetes Maternal Uncle     Social History:  reports that she has never smoked. She has never used smokeless tobacco. She reports that she does not currently use alcohol. She reports that she does not currently use drugs after having used the following drugs: Marijuana.  Allergies: Patient has no known allergies.   Current Medications at time of admission:  Medications Prior to Admission  Medication Sig Dispense Refill Last Dose   diphenhydrAMINE (BENADRYL) 25 MG tablet Take 1 to 2 tablets every 6 hours for the next 24 hours.  Then take 1 to 2 tablets every 6 hours as needed for itching or rash 30 tablet 0    EPINEPHrine 0.3 mg/0.3 mL IJ SOAJ injection Inject 0.3 mg into the muscle as needed for anaphylaxis. 1 each 0    famotidine (PEPCID) 20 MG tablet Take 1 tablet (20 mg total) by mouth 2 (two) times daily. 14 tablet 0    HYDROcodone-acetaminophen (NORCO/VICODIN) 5-325 MG tablet Take 1-2 tablets by mouth every 6 (six) hours as needed. 8 tablet 0    metroNIDAZOLE (FLAGYL) 500 MG tablet Take 1 tablet (500 mg total) by mouth 2 (two) times daily. 14 tablet 0    ondansetron (ZOFRAN ODT) 4 MG disintegrating tablet Take 1 tablet (4 mg total) by mouth every 8 (eight) hours as needed for nausea or vomiting. 6 tablet 0  oxyCODONE-acetaminophen (PERCOCET/ROXICET) 5-325 MG tablet Take 1-2 tablets by mouth every 6 (six) hours as needed for severe pain. 8 tablet 0    predniSONE (DELTASONE) 20 MG tablet 2 tabs po daily x 3 days 6 tablet 0    Prenatal Vit-Fe Fumarate-FA (PRENATAL MULTIVITAMIN) TABS tablet Take 1 tablet by mouth daily at 12 noon.      sertraline (ZOLOFT) 25 MG tablet Take 25 mg by mouth daily.       Review of Systems: Review of Systems  All other systems reviewed and are negative.  Physical Exam: Vital signs and nursing notes  reviewed.  No data found.   General: AAO x 3, NAD Heart: RRR Lungs:CTAB Abdomen: Gravid, NT Extremities: no edema SVE: Dilation: 6 Effacement (%): 90 Station: 0 Presentation: Vertex Exam by: Marchelle Folks, CNM  FHR: 125BPM, moderate variability, + accels, no decels TOCO: Contractions q 2-3 minutes  Labs:   No results for input(s): "WBC", "HGB", "HCT", "PLT" in the last 72 hours.  Assessment/Plan: 42 y.o. G4P3003 at [redacted]w[redacted]d, active labor TOLAC, history of 2 previous successful VBACs AMA Depression on Zoloft  Fetal wellbeing - FHT category 1 EFW AGA 7-8lbs  Labor: Active labor, plan AROM prn  GBS negative Rubella immune Rh positive  Pain control: desires unmedicated labor, open to labor Analgesia/anesthesia PRN  Anticipated MOD: VBAC  Plans to breastfeed, plans circumcision. POC discussed with patient and support team, all questions answered.  Dr. Juliene Pina notified of admission/plan of care.  June Leap CNM, MSN 08/10/2022, 6:19 AM

## 2022-08-10 NOTE — MAU Note (Signed)
Pt says UC strong since 10pm Denies HSV GBS- Neg

## 2022-08-10 NOTE — MAU Note (Signed)
Pt arrived with strong UC's - brought to Rm 123 via W/C.

## 2022-08-11 LAB — CBC
HCT: 34.2 % — ABNORMAL LOW (ref 36.0–46.0)
Hemoglobin: 11.7 g/dL — ABNORMAL LOW (ref 12.0–15.0)
MCH: 32.6 pg (ref 26.0–34.0)
MCHC: 34.2 g/dL (ref 30.0–36.0)
MCV: 95.3 fL (ref 80.0–100.0)
Platelets: 199 10*3/uL (ref 150–400)
RBC: 3.59 MIL/uL — ABNORMAL LOW (ref 3.87–5.11)
RDW: 14 % (ref 11.5–15.5)
WBC: 12.7 10*3/uL — ABNORMAL HIGH (ref 4.0–10.5)
nRBC: 0 % (ref 0.0–0.2)

## 2022-08-11 LAB — T.PALLIDUM AB, TOTAL: T Pallidum Abs: NONREACTIVE

## 2022-08-11 NOTE — Discharge Summary (Signed)
OB Discharge Summary  Patient Name: Chelsea Mullen DOB: 1980-07-30 MRN: [id number]  Date of admission: 08/10/2022 Delivering provider: Dorisann Frames K   Admitting diagnosis: Normal labor [O80, Z37.9] Intrauterine pregnancy: [redacted]w[redacted]d     Secondary diagnosis: Patient Active Problem List   Diagnosis Date Noted   Normal labor 08/10/2022   VBAC (vaginal birth after Cesarean) 08/10/2022   AMA (advanced maternal age) multigravida 35+ 08/10/2022   Postpartum care following vaginal delivery 6/11 08/10/2022   Depression 08/10/2022    Date of discharge: 08/11/2022   Discharge diagnosis: Principal Problem:   Postpartum care following vaginal delivery 6/11 Active Problems:   Normal labor   VBAC (vaginal birth after Cesarean)   AMA (advanced maternal age) multigravida 35+   Depression                                                           Augmentation: AROM Pain control: None  Laceration:1st degree  Complications: None  Hospital course:  Onset of Labor With Vaginal Delivery      42 y.o. yo [name] at [redacted]w[redacted]d was admitted in Active Labor on 08/10/2022.  Membrane Rupture Time/Date: 6:26 AM ,08/10/2022   Delivery Method:VBAC, Spontaneous  Episiotomy: None  Lacerations:  1st degree  Patient had an uncomplicated postpartum course. She had an EPDS of 15 on PP day 1. She denies SI/HI. She is currently taking Zoloft 25 mg daily. She will follow-up in the office in 2 weeks for a mood check. She is ambulating, tolerating a regular diet, passing flatus, and urinating well. Patient is discharged home in stable condition on 08/11/22.  Newborn Data: Birth date:08/10/2022  Birth time:6:49 AM  Gender:Female  Living status:Living  Apgars:8 ,9  Weight:3380 g   Physical exam  Vitals:   08/10/22 1340 08/10/22 1740 08/10/22 2246 08/11/22 0459  BP: 110/70 100/63 106/67 97/62  Pulse: 83 72 72 86  Resp: 16 16 18 17   Temp: 98.1 F (36.7 C) 98.1 F (36.7 C) 97.7 F (36.5 C) 97.7 F (36.5 C)  TempSrc: Oral  Oral Oral Oral  SpO2: 100% 99% 97% 97%  Weight:      Height:       General: alert, cooperative, and no distress Lochia: appropriate Uterine Fundus: firm Perineum: repair intact, no edema DVT Evaluation: No evidence of DVT seen on physical exam.  Labs: Lab Results  Component Value Date   WBC 12.7 (H) 08/11/2022   HGB 11.7 (L) 08/11/2022   HCT 34.2 (L) 08/11/2022   MCV 95.3 08/11/2022   PLT 199 08/11/2022      08/10/2022   10:46 PM  Edinburgh Postnatal Depression Scale Screening Tool  I have been able to laugh and see the funny side of things. 1  I have looked forward with enjoyment to things. 1  I have blamed myself unnecessarily when things went wrong. 1  I have been anxious or worried for no good reason. 2  I have felt scared or panicky for no good reason. 2  Things have been getting on top of me. 3  I have been so unhappy that I have had difficulty sleeping. 2  I have felt sad or miserable. 1  I have been so unhappy that I have been crying. 1  The thought of harming myself has occurred to me. 1  Edinburgh Postnatal Depression Scale Total 15   Discharge instructions:  per After Visit Summary  After Visit Meds:  Allergies as of 08/11/2022   No Known Allergies      Medication List     STOP taking these medications    diphenhydrAMINE 25 MG tablet Commonly known as: BENADRYL   EPINEPHrine 0.3 mg/0.3 mL Soaj injection Commonly known as: EPI-PEN   famotidine 20 MG tablet Commonly known as: PEPCID   HYDROcodone-acetaminophen 5-325 MG tablet Commonly known as: NORCO/VICODIN   metroNIDAZOLE 500 MG tablet Commonly known as: FLAGYL   ondansetron 4 MG disintegrating tablet Commonly known as: Zofran ODT   oxyCODONE-acetaminophen 5-325 MG tablet Commonly known as: PERCOCET/ROXICET   predniSONE 20 MG tablet Commonly known as: DELTASONE   prenatal multivitamin Tabs tablet       TAKE these medications    sertraline 25 MG tablet Commonly known as:  ZOLOFT Take 25 mg by mouth daily.       Activity: Advance as tolerated. Pelvic rest for 6 weeks.   Newborn Data: Live born female  Birth Weight: 7 lb 7.2 oz (3380 g) APGAR: 8, 9  Newborn Delivery   Birth date/time: 08/10/2022 06:49:34 Delivery type: VBAC, Spontaneous     Named [name] Baby Feeding: Bottle and Breast Circumcision: Completed, Dr. Conni Elliot Disposition:home with mother  Delivery Report:   Review the Delivery Report for details.    Follow up:  Follow-up Information     June Leap, CNM. Schedule an appointment as soon as possible for a visit in 2 week(s).   Specialty: Certified Nurse Midwife Contact information: [address] [place] [address] [phone]                June Leap, CNM, MSN 08/11/2022, 8:51 AM

## 2022-08-11 NOTE — Progress Notes (Signed)
CSW received a consult for edinburgh score of 15,anxiety and depression. CSW met MOB at bedside to complete a mental health assessment and offer support. CSW entered the room, introduced herself and explained the reason for the visit. MOB presented as calm, was agreeable to consult and remained engaged throughout encounter.   CSW acknowledged Edinburgh score of 15 and listened to MOB explore her feelings about transitioning into motherhood. MOB reported experiencing depression during and after pregnancy along with anxiety, this has been consistent with all her pregnancies.  CSW inquired about MOB's mental health history. MOB reported being diagnosed with anxiety and depression during her 1st pregnancy, due to passed childhood trauma and work related struggles. 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 MOB reported being consistently on medication for support since 2014, and currently prescribed Zoloft.  MOB denied participating in therapy in the past; however since reestablishing her benefits with the [place], they are offering 10 free counseling visits, that she is considering participating in for support. CSW provided PP specialized and a general therapy list in support of her participating in therapy. MOB reported currently feeling "good" and bonded well with infant. CSW provided education regarding the baby blues period vs. perinatal mood disorders, discussed treatment and gave resources for mental health follow up if concerns arise.  CSW recommends self-evaluation during the postpartum time period using the New Mom Checklist from Postpartum Progress and encouraged MOB to contact a medical professional if symptoms are noted at any time. CSW assessed for safety with MOB SI/HI/DV;MOB denied all.   CSW asked MOB has she chosen a pediatrician for infant's follow up visits; MOB said [facility] [place]. MOB reported having all essential  items for infant including a carseat, bassinet and crib for safe sleeping. CSW provided review of Sudden Infant Death Syndrome (SIDS) precautions.   CSW identifies no further need for intervention and no barriers to discharge at this time.  Enos Fling, Theresia Majors Clinical Social Worker [phone]

## 2022-08-17 ENCOUNTER — Telehealth (HOSPITAL_COMMUNITY): Payer: Self-pay | Admitting: *Deleted

## 2022-08-17 NOTE — Telephone Encounter (Signed)
Hospital inpatient EPDS=15. Patient answered (1) to question #10. CSW consult completed during stay. EPDS score faxed to Dr. Juliene Pina for review. Deforest Hoyles, RN, 08/17/22, [phone]

## 2022-08-20 ENCOUNTER — Inpatient Hospital Stay (HOSPITAL_COMMUNITY): Admission: RE | Admit: 2022-08-20 | Source: Home / Self Care | Admitting: Obstetrics & Gynecology

## 2022-08-20 ENCOUNTER — Inpatient Hospital Stay (HOSPITAL_COMMUNITY)

## 2022-08-30 ENCOUNTER — Telehealth (HOSPITAL_COMMUNITY): Payer: Self-pay | Admitting: *Deleted

## 2022-08-30 NOTE — Telephone Encounter (Signed)
08/30/2022  Name: Chelsea Mullen MRN: [id number] DOB: [birth year]  Reason for Call:  Transition of Care Hospital Discharge Call  Contact Status: Patient Contact Status: Message  Language assistant needed:          Follow-Up Questions:    Chelsea Mullen Postnatal Depression Scale:  In the Past 7 Days:    PHQ2-9 Depression Scale:     Discharge Follow-up:    Post-discharge interventions: NA  Chelsea Saner, RN 08/30/22 12:45pm

## 2022-09-01 ENCOUNTER — Telehealth (HOSPITAL_COMMUNITY): Payer: Self-pay | Admitting: *Deleted

## 2022-09-01 NOTE — Telephone Encounter (Signed)
09/01/2022  Name: Chelsea Mullen MRN: [id number] DOB: [birth year]  Reason for Call:  Transition of Care Hospital Discharge Call  Contact Status: Patient Contact Status: Complete  Language assistant needed: Interpreter Mode: Interpreter Not Needed        Follow-Up Questions: Do You Have Any Concerns About Your Health As You Heal From Delivery?: Yes What Concerns Do You Have About Your Health?: sciatic pain, resolving now, encouraged patient to call provider if it does not continue to get better Do You Have Any Concerns About Your Infants Health?: Yes What Concerns Do You Have About Your Baby?: Jaundice, baby was pale, warm (but Temps never showed fever), and fussy a few days ago, baby also having "blow-out" poops - dehydrated?, not tolerating breastmilk?, "he latches differently than my other babies, more pinching" - Baby has appointment with pediatrician today in ~1 hour, encouraged patient to keep peds appt and discuss all of the above.  Also gave number for Out Patient LC.  Edinburgh Postnatal Depression Scale:  In the Past 7 Days: I have been able to laugh and see the funny side of things.: As much as I always could I have looked forward with enjoyment to things.: As much as I ever did I have blamed myself unnecessarily when things went wrong.: Not very often I have been anxious or worried for no good reason.: Hardly ever I have felt scared or panicky for no good reason.: No, not at all Things have been getting on top of me.: No, most of the time I have coped quite well I have been so unhappy that I have had difficulty sleeping.: Not at all I have felt sad or miserable.: No, not at all I have been so unhappy that I have been crying.: Only occasionally The thought of harming myself has occurred to me.: Never Edinburgh Postnatal Depression Scale Total: 4  PHQ2-9 Depression Scale:     Discharge Follow-up: Edinburgh score requires follow up?: No Patient was advised of the following  resources:: Breastfeeding Support Group, Lactation, Support Group Did patient express any COVID concerns?: No  Post-discharge interventions: Reviewed Newborn Safe Sleep Practices  Salena Saner, RN 09/01/22 10:22 am

## 2022-12-01 ENCOUNTER — Other Ambulatory Visit: Payer: Self-pay

## 2022-12-01 ENCOUNTER — Emergency Department (HOSPITAL_BASED_OUTPATIENT_CLINIC_OR_DEPARTMENT_OTHER)
Admission: EM | Admit: 2022-12-01 | Discharge: 2022-12-01 | Disposition: A | Discharge status: Home / Self Care | Attending: Emergency Medicine | Admitting: Emergency Medicine

## 2022-12-01 ENCOUNTER — Emergency Department (HOSPITAL_BASED_OUTPATIENT_CLINIC_OR_DEPARTMENT_OTHER)

## 2022-12-01 ENCOUNTER — Encounter (HOSPITAL_BASED_OUTPATIENT_CLINIC_OR_DEPARTMENT_OTHER): Payer: Self-pay | Admitting: Pediatrics

## 2022-12-01 DIAGNOSIS — R0789 Other chest pain: Secondary | ICD-10-CM | POA: Insufficient documentation

## 2022-12-01 DIAGNOSIS — R079 Chest pain, unspecified: Secondary | ICD-10-CM

## 2022-12-01 DIAGNOSIS — R0602 Shortness of breath: Secondary | ICD-10-CM | POA: Insufficient documentation

## 2022-12-01 LAB — BASIC METABOLIC PANEL
Anion gap: 9 (ref 5–15)
BUN: 16 mg/dL (ref 6–20)
CO2: 23 mmol/L (ref 22–32)
Calcium: 9.1 mg/dL (ref 8.9–10.3)
Chloride: 103 mmol/L (ref 98–111)
Creatinine, Ser: 0.65 mg/dL (ref 0.44–1.00)
GFR, Estimated: >60 mL/min (ref >60)
Glucose, Bld: 92 mg/dL (ref 70–99)
Potassium: 3.8 mmol/L (ref 3.5–5.1)
Sodium: 135 mmol/L (ref 135–145)

## 2022-12-01 LAB — TROPONIN I (HIGH SENSITIVITY): Troponin I (High Sensitivity): <2 ng/L (ref <18)

## 2022-12-01 LAB — CBC
HCT: 42 % (ref 36.0–46.0)
Hemoglobin: 14.3 g/dL (ref 12.0–15.0)
MCH: 30.8 pg (ref 26.0–34.0)
MCHC: 34 g/dL (ref 30.0–36.0)
MCV: 90.3 fL (ref 80.0–100.0)
Platelets: 256 10*3/uL (ref 150–400)
RBC: 4.65 MIL/uL (ref 3.87–5.11)
RDW: 12.8 % (ref 11.5–15.5)
WBC: 5.5 10*3/uL (ref 4.0–10.5)
nRBC: 0 % (ref 0.0–0.2)

## 2022-12-01 LAB — PREGNANCY, URINE: Preg Test, Ur: NEGATIVE

## 2022-12-01 NOTE — ED Provider Notes (Signed)
EMERGENCY DEPARTMENT AT [facility] Provider Note   CSN: [id number] Arrival date & time: 12/01/22  1253     History  Chief Complaint  Patient presents with   Chest Pain    Chelsea Mullen is a 42 y.o. female.  42 year old female who is 4 months postpartum who presents to the emergency department with chest discomfort.  Reports that for the past month she has had chest discomfort after breast-feeding finishes for her infant.  Describes it as a sometimes sharp pain on either side of her chest.  Says that it recurred today on her right side but persisted longer than usual. Currently 1/10 in severity. Feels like she also had some mild shortness of breath afterwards.  Not exertional.  No diaphoresis.  No vomiting.  Denies history of DVT/PE, hormone use, personal history of cancer, or surgery in the past month.  No breast discharge or redness.       Home Medications Prior to Admission medications   Medication Sig Start Date End Date Taking? Authorizing Provider  sertraline (ZOLOFT) 25 MG tablet Take 25 mg by mouth daily.    [provider]      Allergies    Patient has no known allergies.    Review of Systems   Review of Systems  Physical Exam Updated Vital Signs BP 108/77 (BP Location: Left Arm)   Pulse 68   Temp 97.8 F (36.6 C)   Resp 20   Ht 5\' 4"  (1.626 m)   Wt 84.6 kg   SpO2 97%   BMI 32.00 kg/m  Physical Exam Vitals and nursing note reviewed.  Constitutional:      General: She is not in acute distress.    Appearance: She is well-developed.  HENT:     Head: Normocephalic and atraumatic.     Right Ear: External ear normal.     Left Ear: External ear normal.     Nose: Nose normal.  Eyes:     Extraocular Movements: Extraocular movements intact.     Conjunctiva/sclera: Conjunctivae normal.     Pupils: Pupils are equal, round, and reactive to light.  Cardiovascular:     Rate and Rhythm: Normal rate and regular rhythm.     Heart  sounds: No murmur heard.    Comments: Chest wall pain reproducible Pulmonary:     Effort: Pulmonary effort is normal. No respiratory distress.     Breath sounds: Normal breath sounds.  Musculoskeletal:     Cervical back: Normal range of motion and neck supple.     Right lower leg: No edema.     Left lower leg: No edema.  Skin:    General: Skin is warm and dry.     Comments: No chest wall rashes  Neurological:     Mental Status: She is alert and oriented to person, place, and time. Mental status is at baseline.  Psychiatric:        Mood and Affect: Mood normal.     ED Results / Procedures / Treatments   Labs (all labs ordered are listed, but only abnormal results are displayed) Labs Reviewed  BASIC METABOLIC PANEL  CBC  PREGNANCY, URINE  TROPONIN I (HIGH SENSITIVITY)  TROPONIN I (HIGH SENSITIVITY)    EKG EKG Interpretation Date/Time:  Wednesday December 01 2022 13:06:55 EDT Ventricular Rate:  65 PR Interval:  170 QRS Duration:  78 QT Interval:  400 QTC Calculation: 416 R Axis:   71  Text Interpretation: Sinus rhythm Low  voltage, precordial leads Borderline T abnormalities, anterior leads Confirmed by Vonita Moss [id number]) on 12/01/2022 2:12:09 PM  Radiology DG Chest 2 View  Result Date: 12/01/2022 CLINICAL DATA:  Chest pain radiating to right side EXAM: CHEST - 2 VIEW COMPARISON:  None Available. FINDINGS: Normal lung volumes. No focal consolidations. No pleural effusion or pneumothorax. The heart size and mediastinal contours are within normal limits. No acute osseous abnormality. IMPRESSION: No active cardiopulmonary disease. Electronically Signed   By: Agustin Cree M.D.   On: 12/01/2022 14:01    Procedures Procedures    Medications Ordered in ED Medications - No data to display  ED Course/ Medical Decision Making/ A&P                                 Medical Decision Making Amount and/or Complexity of Data Reviewed Labs: ordered. Radiology:  ordered.   Chelsea Mullen is a 42 y.o. female with comorbidities that complicate the patient evaluation including being 4 months postpartum who presents to the emergency department with chest pain after breast-feeding  Initial Ddx:  Mastitis, breast abscess, musculoskeletal pain, MI, PE, pneumonia, spontaneous pneumothorax  MDM/Course:  Patient resents emergency department with breast pain after feeding.  No rashes or discharge or masses that would suggest mastitis or breast abscess.  Does have reproducible pain on exam so feel that is likely musculoskeletal in etiology or perhaps from a clogged duct.  Presentation is very atypical for MI.  EKG and troponin without evidence of acute MI.  Patient is PE RC negative so feel that PE is highly unlikely and she is outside of the window for being hypercoagulable from her pregnancy.  Chest x-ray was obtained that did not show any infiltrates or evidence of pneumothorax.  Upon re-evaluation patient was well-appearing.  Currently her pain is only 1/10 in severity.  Will have her follow-up with her primary doctor for continued evaluation and also counseled her to reach out to her OB/GYN about her pain as well since it appears to be related to breast-feeding.  This patient presents to the ED for concern of complaints listed in HPI, this involves an extensive number of treatment options, and is a complaint that carries with it a high risk of complications and morbidity. Disposition including potential need for admission considered.   Dispo: DC Home. Return precautions discussed including, but not limited to, those listed in the AVS. Allowed pt time to ask questions which were answered fully prior to dc.  Additional history obtained from spouse Records reviewed Outpatient Clinic Notes The following labs were independently interpreted: Chemistry and show no acute abnormality I independently reviewed the following imaging with scope of interpretation limited to  determining acute life threatening conditions related to emergency care: Chest x-ray and agree with the radiologist interpretation with the following exceptions: none I personally reviewed and interpreted cardiac monitoring: normal sinus rhythm  I personally reviewed and interpreted the pt's EKG: see above for interpretation  I have reviewed the patients home medications and made adjustments as needed  Portions of this note were generated with Dragon dictation software. Dictation errors may occur despite best attempts at proofreading.           Final Clinical Impression(s) / ED Diagnoses Final diagnoses:  Chest pain, unspecified type  Breast feeding status of mother    Rx / DC Orders ED Discharge Orders     None  Rondel Baton, MD 12/01/22 [phone]

## 2022-12-01 NOTE — ED Triage Notes (Signed)
C/O occasional sharp pain in upper mid epigastric pain radiates to the right side occurs usually after breastfeeding x 1 month.

## 2022-12-01 NOTE — Discharge Instructions (Signed)
You were seen for your chest discomfort in the emergency department.   At home, please continue breast-feeding.  Take Tylenol as needed for your pain.    Check your MyChart online for the results of any tests that had not resulted by the time you left the emergency department.   Follow-up with your primary doctor in 2-3 days regarding your visit.  Please also follow-up with your OB/GYN.  Return immediately to the emergency department if you experience any of the following: Worsening pain, or any other concerning symptoms.    Thank you for visiting our Emergency Department. It was a pleasure taking care of you today.
